# Patient Record
Sex: Female | Born: 1955 | Race: White | Hispanic: No | Smoking: Never smoker
Health system: Southern US, Community
[De-identification: ages and names within clinical notes are randomized; demographics above are authoritative.]

## PROBLEM LIST (undated history)

## (undated) DIAGNOSIS — N189 Chronic kidney disease, unspecified: Secondary | ICD-10-CM

## (undated) DIAGNOSIS — I700 Atherosclerosis of aorta: Secondary | ICD-10-CM

## (undated) DIAGNOSIS — E785 Hyperlipidemia, unspecified: Secondary | ICD-10-CM

## (undated) DIAGNOSIS — N183 Chronic kidney disease, stage 3 unspecified: Secondary | ICD-10-CM

## (undated) DIAGNOSIS — Z789 Other specified health status: Secondary | ICD-10-CM

## (undated) DIAGNOSIS — R0683 Snoring: Secondary | ICD-10-CM

## (undated) DIAGNOSIS — I2510 Atherosclerotic heart disease of native coronary artery without angina pectoris: Secondary | ICD-10-CM

## (undated) HISTORY — DX: Atherosclerosis of aorta: I70.0

## (undated) HISTORY — DX: Hyperlipidemia, unspecified: E78.5

## (undated) HISTORY — DX: Chronic kidney disease, stage 3 unspecified: N18.30

## (undated) HISTORY — DX: Atherosclerotic heart disease of native coronary artery without angina pectoris: I25.10

## (undated) HISTORY — DX: Chronic kidney disease, unspecified: N18.9

---

## 1998-05-19 ENCOUNTER — Other Ambulatory Visit: Admission: RE | Admit: 1998-05-19 | Discharge: 1998-05-19 | Payer: Self-pay | Admitting: Obstetrics and Gynecology

## 1998-08-15 HISTORY — PX: BREAST SURGERY: SHX581

## 1998-11-06 ENCOUNTER — Ambulatory Visit (HOSPITAL_COMMUNITY): Admission: RE | Admit: 1998-11-06 | Discharge: 1998-11-06 | Payer: Self-pay | Admitting: *Deleted

## 2000-07-10 ENCOUNTER — Ambulatory Visit (HOSPITAL_COMMUNITY): Admission: RE | Admit: 2000-07-10 | Discharge: 2000-07-10 | Payer: Self-pay | Admitting: Gastroenterology

## 2001-07-04 ENCOUNTER — Other Ambulatory Visit: Admission: RE | Admit: 2001-07-04 | Discharge: 2001-07-04 | Payer: Self-pay | Admitting: *Deleted

## 2001-08-31 ENCOUNTER — Other Ambulatory Visit: Admission: RE | Admit: 2001-08-31 | Discharge: 2001-08-31 | Payer: Self-pay | Admitting: *Deleted

## 2002-08-05 ENCOUNTER — Other Ambulatory Visit: Admission: RE | Admit: 2002-08-05 | Discharge: 2002-08-05 | Payer: Self-pay | Admitting: Obstetrics and Gynecology

## 2002-12-16 ENCOUNTER — Ambulatory Visit (HOSPITAL_COMMUNITY): Admission: RE | Admit: 2002-12-16 | Discharge: 2002-12-16 | Payer: Self-pay | Admitting: Family Medicine

## 2002-12-16 ENCOUNTER — Encounter: Payer: Self-pay | Admitting: Family Medicine

## 2003-09-09 ENCOUNTER — Other Ambulatory Visit: Admission: RE | Admit: 2003-09-09 | Discharge: 2003-09-09 | Payer: Self-pay | Admitting: Obstetrics and Gynecology

## 2005-05-06 ENCOUNTER — Ambulatory Visit: Payer: Self-pay | Admitting: Cardiology

## 2005-06-17 ENCOUNTER — Ambulatory Visit: Payer: Self-pay | Admitting: Cardiology

## 2005-06-29 ENCOUNTER — Ambulatory Visit: Payer: Self-pay | Admitting: Cardiology

## 2005-08-19 ENCOUNTER — Ambulatory Visit: Payer: Self-pay | Admitting: Cardiology

## 2005-09-01 ENCOUNTER — Ambulatory Visit: Payer: Self-pay | Admitting: Cardiology

## 2005-09-23 ENCOUNTER — Ambulatory Visit: Payer: Self-pay | Admitting: Cardiovascular Disease

## 2005-09-29 ENCOUNTER — Ambulatory Visit: Payer: Self-pay | Admitting: Internal Medicine

## 2005-11-09 ENCOUNTER — Ambulatory Visit: Payer: Self-pay | Admitting: Cardiology

## 2005-11-10 ENCOUNTER — Ambulatory Visit: Payer: Self-pay | Admitting: Internal Medicine

## 2006-05-11 ENCOUNTER — Ambulatory Visit: Payer: Self-pay

## 2006-05-11 ENCOUNTER — Ambulatory Visit: Payer: Self-pay | Admitting: Cardiology

## 2007-02-12 ENCOUNTER — Ambulatory Visit: Payer: Self-pay

## 2007-02-12 LAB — CONVERTED CEMR LAB
ALT: 19 units/L (ref 0–35)
AST: 22 units/L (ref 0–37)
Albumin: 3.5 g/dL (ref 3.5–5.2)
Alkaline Phosphatase: 33 units/L — ABNORMAL LOW (ref 39–117)
Bilirubin, Direct: 0.1 mg/dL (ref 0.0–0.3)
Cholesterol: 260 mg/dL (ref 0–200)
Direct LDL: 175.1 mg/dL
HDL: 54.3 mg/dL (ref >39.0)
Total Bilirubin: 1.1 mg/dL (ref 0.3–1.2)
Total CHOL/HDL Ratio: 4.8
Total Protein: 6.2 g/dL (ref 6.0–8.3)
Triglycerides: 66 mg/dL (ref 0–149)
VLDL: 13 mg/dL (ref 0–40)

## 2008-03-05 ENCOUNTER — Ambulatory Visit: Payer: Self-pay | Admitting: Internal Medicine

## 2008-03-05 LAB — CONVERTED CEMR LAB
ALT: 16 units/L (ref 0–35)
AST: 16 units/L (ref 0–37)
Albumin: 3.9 g/dL (ref 3.5–5.2)
Alkaline Phosphatase: 44 units/L (ref 39–117)
Bilirubin, Direct: 0.1 mg/dL (ref 0.0–0.3)
Cholesterol: 255 mg/dL (ref 0–200)
Direct LDL: 181.5 mg/dL
HDL: 54.6 mg/dL (ref >39.0)
Total Bilirubin: 1.4 mg/dL — ABNORMAL HIGH (ref 0.3–1.2)
Total CHOL/HDL Ratio: 4.7
Total Protein: 7 g/dL (ref 6.0–8.3)
Triglycerides: 94 mg/dL (ref 0–149)
VLDL: 19 mg/dL (ref 0–40)

## 2008-03-10 ENCOUNTER — Ambulatory Visit: Payer: Self-pay | Admitting: Cardiovascular Disease

## 2008-04-24 ENCOUNTER — Ambulatory Visit: Payer: Self-pay | Admitting: Cardiology

## 2008-04-24 LAB — CONVERTED CEMR LAB
ALT: 25 units/L (ref 0–35)
AST: 19 units/L (ref 0–37)
Albumin: 4.1 g/dL (ref 3.5–5.2)
Alkaline Phosphatase: 40 units/L (ref 39–117)
Bilirubin, Direct: 0.1 mg/dL (ref 0.0–0.3)
Cholesterol: 269 mg/dL (ref 0–200)
Direct LDL: 204.6 mg/dL
HDL: 56.2 mg/dL (ref >39.0)
Total Bilirubin: 1.3 mg/dL — ABNORMAL HIGH (ref 0.3–1.2)
Total CHOL/HDL Ratio: 4.8
Total Protein: 7 g/dL (ref 6.0–8.3)
Triglycerides: 87 mg/dL (ref 0–149)
VLDL: 17 mg/dL (ref 0–40)

## 2008-04-28 ENCOUNTER — Ambulatory Visit: Payer: Self-pay

## 2008-06-12 ENCOUNTER — Ambulatory Visit: Payer: Self-pay | Admitting: Internal Medicine

## 2008-06-12 LAB — CONVERTED CEMR LAB
ALT: 26 units/L (ref 0–35)
AST: 20 units/L (ref 0–37)
Albumin: 3.9 g/dL (ref 3.5–5.2)
Alkaline Phosphatase: 49 units/L (ref 39–117)
Bilirubin, Direct: 0.2 mg/dL (ref 0.0–0.3)
Cholesterol: 194 mg/dL (ref 0–200)
HDL: 51.6 mg/dL (ref >39.0)
LDL Cholesterol: 131 mg/dL — ABNORMAL HIGH (ref 0–99)
Total Bilirubin: 1.2 mg/dL (ref 0.3–1.2)
Total CHOL/HDL Ratio: 3.8
Total Protein: 7.1 g/dL (ref 6.0–8.3)
Triglycerides: 56 mg/dL (ref 0–149)
VLDL: 11 mg/dL (ref 0–40)

## 2008-06-16 ENCOUNTER — Ambulatory Visit: Payer: Self-pay | Admitting: Cardiovascular Disease

## 2008-07-31 ENCOUNTER — Ambulatory Visit: Payer: Self-pay

## 2008-07-31 LAB — CONVERTED CEMR LAB
ALT: 25 units/L (ref 0–35)
AST: 22 units/L (ref 0–37)
Albumin: 4 g/dL (ref 3.5–5.2)
Alkaline Phosphatase: 46 units/L (ref 39–117)
Bilirubin, Direct: 0.1 mg/dL (ref 0.0–0.3)
Cholesterol: 182 mg/dL (ref 0–200)
HDL: 48.9 mg/dL (ref >39.0)
LDL Cholesterol: 124 mg/dL — ABNORMAL HIGH (ref 0–99)
Total Bilirubin: 0.8 mg/dL (ref 0.3–1.2)
Total CHOL/HDL Ratio: 3.7
Total Protein: 6.9 g/dL (ref 6.0–8.3)
Triglycerides: 44 mg/dL (ref 0–149)
VLDL: 9 mg/dL (ref 0–40)

## 2008-08-04 ENCOUNTER — Ambulatory Visit: Payer: Self-pay | Admitting: Cardiology

## 2009-01-14 ENCOUNTER — Ambulatory Visit: Payer: Self-pay | Admitting: Internal Medicine

## 2009-01-14 DIAGNOSIS — E7800 Pure hypercholesterolemia, unspecified: Secondary | ICD-10-CM | POA: Insufficient documentation

## 2009-01-14 LAB — CONVERTED CEMR LAB
ALT: 23 units/L (ref 0–35)
AST: 22 units/L (ref 0–37)
Albumin: 3.8 g/dL (ref 3.5–5.2)
Alkaline Phosphatase: 47 units/L (ref 39–117)
Cholesterol: 187 mg/dL (ref 0–200)
HDL: 50.2 mg/dL (ref >39.00)
Triglycerides: 58 mg/dL (ref 0.0–149.0)

## 2009-01-19 ENCOUNTER — Ambulatory Visit: Payer: Self-pay | Admitting: Cardiovascular Disease

## 2009-07-20 ENCOUNTER — Ambulatory Visit: Payer: Self-pay | Admitting: Cardiology

## 2009-07-20 LAB — CONVERTED CEMR LAB
ALT: 24 units/L (ref 0–35)
Cholesterol: 230 mg/dL — ABNORMAL HIGH (ref 0–200)
Direct LDL: 164 mg/dL
Total Bilirubin: 1 mg/dL (ref 0.3–1.2)
Total CHOL/HDL Ratio: 4

## 2009-07-23 ENCOUNTER — Ambulatory Visit: Payer: Self-pay | Admitting: Cardiovascular Disease

## 2010-01-25 ENCOUNTER — Ambulatory Visit: Payer: Self-pay | Admitting: Cardiovascular Disease

## 2010-01-27 ENCOUNTER — Encounter (INDEPENDENT_AMBULATORY_CARE_PROVIDER_SITE_OTHER): Payer: Self-pay | Admitting: *Deleted

## 2010-01-27 LAB — CONVERTED CEMR LAB
AST: 19 units/L (ref 0–37)
Bilirubin, Direct: 0.2 mg/dL (ref 0.0–0.3)
Cholesterol: 205 mg/dL — ABNORMAL HIGH (ref 0–200)
Total Bilirubin: 1.1 mg/dL (ref 0.3–1.2)
Total CHOL/HDL Ratio: 3
VLDL: 13.4 mg/dL (ref 0.0–40.0)

## 2010-01-28 ENCOUNTER — Ambulatory Visit: Payer: Self-pay | Admitting: Cardiology

## 2010-02-08 ENCOUNTER — Emergency Department (HOSPITAL_COMMUNITY): Admission: EM | Admit: 2010-02-08 | Discharge: 2010-02-09 | Payer: Self-pay | Admitting: Emergency Medicine

## 2010-07-19 ENCOUNTER — Ambulatory Visit: Payer: Self-pay | Admitting: Internal Medicine

## 2010-07-22 ENCOUNTER — Ambulatory Visit: Payer: Self-pay | Admitting: Cardiovascular Disease

## 2010-08-19 ENCOUNTER — Other Ambulatory Visit: Payer: Self-pay | Admitting: Internal Medicine

## 2010-08-19 LAB — HEPATIC FUNCTION PANEL
ALT: 35 U/L (ref 0–35)
AST: 24 U/L (ref 0–37)
Albumin: 4 g/dL (ref 3.5–5.2)
Alkaline Phosphatase: 55 U/L (ref 39–117)
Bilirubin, Direct: 0.1 mg/dL (ref 0.0–0.3)
Total Bilirubin: 1.2 mg/dL (ref 0.3–1.2)
Total Protein: 6.8 g/dL (ref 6.0–8.3)

## 2010-08-19 LAB — LIPID PANEL
Cholesterol: 254 mg/dL — ABNORMAL HIGH (ref 0–200)
HDL: 51.8 mg/dL (ref >39.00)
Total CHOL/HDL Ratio: 5
Triglycerides: 72 mg/dL (ref 0.0–149.0)
VLDL: 14.4 mg/dL (ref 0.0–40.0)

## 2010-08-19 LAB — LDL CHOLESTEROL, DIRECT: Direct LDL: 199.7 mg/dL

## 2010-09-14 NOTE — Assessment & Plan Note (Signed)
Summary: Candice [name]   Visit Type:  Follow-up  CC:  dyslipidemia follow-up.  History of Present Illness:     Lipid Clinic Visit      The patient comes in today for dyslipidemia follow-up.  The patient has no complaints of medication problems, muscle aches, muscle cramps, or back pain.  She has been compliant with Crestor 40mg  daily.  Pt broke her leg in January and had to wear a boot for 14 weeks.  This has limited her exercise over the past few months.  She has not been able to do her normal aerobic exercise but has tried to get some exercise in as well as yoga.  Dietary compliance review reveals she is trying to eat a healthy diet.  Her diet consists mostly of salads and protien shakes.  If she does eat protein it is mainly fish and occassionally chicken but very little red meat.  Given her recent broken bone, we discussed her calcium intake.  She does drink an iced latte from [facility] every morning and eats yogurt many days of the week.  She currently takes 1 calcium 600mg  with vitamin D.      Lipid Management Provider  Weston Brass, PharmD  Current Medications (verified): 1)  Crestor 40 Mg Tabs (Rosuvastatin Calcium) .... Take One Tablet By Mouth Daily. 2)  Calcium-Vitamin D 250-125 Mg-Unit Tabs (Calcium Carbonate-Vitamin D) .... Take 1 Tablet Daily 3)  Aspirin 81 Mg Tbec (Aspirin) .... Take One Tablet By Mouth Daily  Allergies (verified): No Known Drug Allergies  Past History:  Family History: Last updated: 02-15-10 Father- died at 63 with MI Mother- alive and well at 56  Family History: Father- died at 45 with MI Mother- alive and well at 46   Vital Signs:  Patient profile:   55 year old female Weight:      189 pounds BP sitting:   124 / 80  (right arm) Cuff size:   regular  Impression & Recommendations:  Problem # 1:  PURE HYPERCHOLESTEROLEMIA (ICD-272.0) Assessment Improved Pt's cholesterol has improved since last checked in December.  Of note, she had not taken  her Crestor on a regular basis before previous lab work.  Her TC is slightly above goal at 205, TG -  67, HDL above goal at 64 and LDL at 130.  Given her age and other risk factors, her Framingham risk score would be 1-2%.  Will aim for goal LDL<130 at this time.  Will not change medications at this time given she is currently right at goal.  If LDL increases, will consider adding additional agent.  Encouraged her to exercise as much as possible given her leg pain.  Will recheck her cholesterol in 6 months.    Her updated medication list for this problem includes:    Crestor 40 Mg Tabs (Rosuvastatin calcium) .Marland Kitchen... Take one tablet by mouth daily.  Patient Instructions: 1)  Continue Crestor 40mg  daily 2)  Continue current diet 3)  Exercise as tolerated  4)  Contact Dr. Felicity Coyer at [facility]- [phone] 5)  Next Lab Appt: 07/19/10 at 8:30 (fasting) 6)  Lipid Appt: 12/8 at 4pm

## 2010-09-14 NOTE — Letter (Signed)
Summary: [facility] - Lipid  [facility], Main Office  [address]   [place], [place] [address]   Phone: [phone]  Fax: [phone]     January 27, 2010 MRN: [id number]   [facility] [address] [place], [place]  [address]   Dear Ms. Epler,  We have reviewed your cholesterol results.  They are as follows:     Total Cholesterol:    205 (Desirable: less than 200)       HDL  Cholesterol:     63.80  (Desirable: greater than 40 for men and 50 for women)       LDL Cholesterol:       125  (Desirable: less than 100 for low risk and less than 70 for moderate to high risk)       Triglycerides:       67.0  (Desirable: less than 150)  Our recommendations include:These numbers look good. Continue on the same medicine. Liver function is normal. Take care, Dr. Leanora Cover.    Call our office at the number listed above if you have any questions.  Lowering your LDL cholesterol is important, but it is only one of a large number of "risk factors" that may indicate that you are at risk for heart disease, stroke or other complications of hardening of the arteries.  Other risk factors include:   A.  Cigarette Smoking* B.  High Blood Pressure* C.  Obesity* D.   Low HDL Cholesterol (see yours above)* E.   Diabetes Mellitus (higher risk if your is uncontrolled) F.  Family history of premature heart disease G.  Previous history of stroke or cardiovascular disease    *These are risk factors YOU HAVE CONTROL OVER.  For more information, visit .  There is now evidence that lowering the TOTAL CHOLESTEROL AND LDL CHOLESTEROL can reduce the risk of heart disease.  The American Heart Association recommends the following guidelines for the treatment of elevated cholesterol:  1.  If there is now current heart disease and less than two risk factors, TOTAL CHOLESTEROL should be less than 200 and LDL CHOLESTEROL should be less than 100. 2.  If there is current heart disease or two  or more risk factors, TOTAL CHOLESTEROL should be less than 200 and LDL CHOLESTEROL should be less than 70.  A diet low in cholesterol, saturated fat, and calories is the cornerstone of treatment for elevated cholesterol.  Cessation of smoking and exercise are also important in the management of elevated cholesterol and preventing vascular disease.  Studies have shown that 30 to 60 minutes of physical activity most days can help lower blood pressure, lower cholesterol, and keep your weight at a healthy level.  Drug therapy is used when cholesterol levels do not respond to therapeutic lifestyle changes (smoking cessation, diet, and exercise) and remains unacceptably high.  If medication is started, it is important to have you levels checked periodically to evaluate the need for further treatment options.  Thank you,    [employer] Team

## 2010-09-16 NOTE — Assessment & Plan Note (Signed)
Summary: rov/sp  Medications Added DAILY MULTIPLE VITAMINS  TABS (MULTIPLE VITAMIN) take 1 tablet daily      Allergies Added: ! * ZETIA  Visit Type:  Follow-up  CC:  dyslipidemia follow-up.  History of Present Illness:  Lipid Clinic Visit      The patient comes in today for dyslipidemia follow-up.  Her past medical history is significant for dyslipidemia only- no history of HTN, DM, or any other cardiac disease.  Despite meeting LDL goals in June 2011(130 mg/dL), her LDL has increased significantly to 199.7 mg/dL this month.  This follow-up visit will address the causes of this worsening of her dyslipidemia.  Dietary compliance review reveals an overall grade of limiting fats and TFA's.  Her breakfast consists of a non-fat latte and a [employer].  Lunch and dinner most often consists of chicken and vegetables.  Snacks throughout the day include yogurt or almonds.  She typically consumes 1 glass of wine a night.  Review of exercise habits reveals that the patient is exercising with a trainer 3 times a week for 60 minutes.  Despite relatively strict diet and excerise program for multiple months, patient has not lost weight.  Patient admits losing track with her low fat diet during the holiday season and believes her most recent LDL is a reflection of her recent eating habits.  Compliance with Crestor is good, but admits missing her tablets  ~4 days per month.  She denies problems with Crestor and tolerates the medication well.   Lipid Clinic Visit      The patient comes in today for dyslipidemia follow-up.  The patient has no complaints of medication problems, muscle aches, muscle cramps, or back pain.  She has been compliant with Crestor 40mg  daily.  Pt broke her leg in January and had to wear a boot for 14 weeks.  This has limited her exercise over the past few months.  She has not been able to do her normal aerobic exercise but has tried to get some exercise in as well as yoga.  Dietary  compliance review reveals she is trying to eat a healthy diet.  Her diet consists mostly of salads and protien shakes.  If she does eat protein it is mainly fish and occassionally chicken but very little red meat.  Given her recent broken bone, we discussed her calcium intake.  She does drink an iced latte from [facility] every morning and eats yogurt many days of the week.  She currently takes 1 calcium 600mg  with vitamin D.      Lipid Management Provider  Weston Brass, PharmD  Preventive Screening-Counseling & Management  Alcohol-Tobacco     Alcohol drinks/day: 1     Alcohol type: wine     Smoking Status: never  Caffeine-Diet-Exercise     Caffeine use/day: 1 non-fat latte in AM     Caffeine Counseling: not indicated; caffeine use is not excessive or problematic     Does Patient Exercise: yes     Type of exercise: sessions with trainer     Exercise (avg: min/session): 60     Times/week: 3  Current Medications (verified): 1)  Crestor 40 Mg Tabs (Rosuvastatin Calcium) .... Take One Tablet By Mouth Daily. 2)  Calcium-Vitamin D 250-125 Mg-Unit Tabs (Calcium Carbonate-Vitamin D) .... Take 1 Tablet Daily 3)  Aspirin 81 Mg Tbec (Aspirin) .... Take One Tablet By Mouth Daily 4)  Daily Multiple Vitamins  Tabs (Multiple Vitamin) .... Take 1 Tablet Daily  Allergies (verified):  1)  ! * Zetia  Social History: Alcohol drinks/day:  1 Smoking Status:  never Caffeine use/day:  1 non-fat latte in AM Does Patient Exercise:  yes   Vital Signs:  Patient profile:   55 year old female Weight:      182.75 pounds Pulse rate:   70 / minute BP sitting:   118 / 79  Impression & Recommendations:  Problem # 1:  PURE HYPERCHOLESTEROLEMIA (ICD-272.0) Patient's dyslipidemia has worsened since 6 months ago, showing an impressive increase in LDL.  Patient admits to missing  ~4 tablets/month of her Crestor 40mg , which I do not consider substantial enough to raise her LDL by 70 mg/dL.  Patient's normal diet and  exercise plan was on track to controlling LDL; however, patient feels that she has not been following her normal healthy, low-fat diet during the holiday season contributing to most recent high LDL.  Patient would like to recheck lipid panel in 2 months after she returns to normal eating schedule before considering additional lipid lowering medications.  Will recheck lipid panel in March and reassess then. 08/19/10 Lipid Panel: LDL 199.7 mg/dL (goal 130mg /dL) TC 161  TG 72  HDL 09.6 01/25/10 LDL 130 mg/dL  Her updated medication list for this problem includes:    Crestor 40 Mg Tabs (Rosuvastatin calcium) .Marland Kitchen... Take one tablet by mouth daily.  Patient Instructions: 1)  Continue taking Crestor 40mg .  2)  Continue with diet and exercise plan. 3)  Return to clinic in March for follow-up. Prescriptions: CRESTOR 40 MG TABS (ROSUVASTATIN CALCIUM) Take one tablet by mouth daily.  #90 x 3   Entered by:   Louann Sjogren PharmD   Authorized by:   Norva Karvonen, MD   Signed by:   Louann Sjogren PharmD on 08/23/2010   Method used:   Faxed to ...       [facility] (mail-order)             , [place]         Ph: [phone]       Fax: [phone]   RxID:   [id number]

## 2010-10-31 LAB — DIFFERENTIAL
Basophils Absolute: 0 10*3/uL (ref 0.0–0.1)
Basophils Relative: 0 % (ref 0–1)
Eosinophils Absolute: 0.1 10*3/uL (ref 0.0–0.7)
Neutro Abs: 4.5 10*3/uL (ref 1.7–7.7)
Neutrophils Relative %: 56 % (ref 43–77)

## 2010-10-31 LAB — BASIC METABOLIC PANEL
BUN: 10 mg/dL (ref 6–23)
Calcium: 9.6 mg/dL (ref 8.4–10.5)
GFR calc non Af Amer: >60 mL/min (ref >60)
Glucose, Bld: 88 mg/dL (ref 70–99)
Sodium: 143 mEq/L (ref 135–145)

## 2010-10-31 LAB — POCT CARDIAC MARKERS
Myoglobin, poc: 71.7 ng/mL (ref 12–200)
Troponin i, poc: <0.05 ng/mL (ref 0.00–0.09)

## 2010-10-31 LAB — CBC
Hemoglobin: 13.8 g/dL (ref 12.0–15.0)
MCHC: 34.7 g/dL (ref 30.0–36.0)
RDW: 13.4 % (ref 11.5–15.5)

## 2010-11-01 ENCOUNTER — Other Ambulatory Visit: Payer: Self-pay

## 2010-11-08 ENCOUNTER — Ambulatory Visit: Payer: Self-pay

## 2010-12-28 NOTE — Assessment & Plan Note (Signed)
[facility]                               LIPID CLINIC NOTE   NAME:Candice [name], Candice [name]                 MRN:          [id number]  DATE:08/04/2008                            DOB:          Oct 02, 1955    HISTORY:  Candice [name] is seen back in the Lipid Clinic.  She has been  feeling and doing well overall.  She has been working on eating more  hopefully.  She is preparing to leave her job and she is looking very  possibly about this.   PAST MEDICAL HISTORY:  Pertinent for high risk features.   CURRENT MEDICATIONS:  1. Crestor 20 mg daily.  2. Zetia 10 mg daily.   REVIEW OF SYSTEMS:  As stated in the HPI, otherwise negative.   PHYSICAL EXAMINATION:  VITAL SIGNS:  Weight today is 184 pounds, blood  pressure is 140/72, respirations are 16.   Labs from June 12, 2008, reveal normal LFTs, total cholesterol 194,  triglycerides 56, HDL 56, and LDL 131.  Further labs obtained on  July 31, 2008, reveal normal LFTs, a total cholesterol of 182,  triglycerides 44, HDL 48.9, and LDL 124.   ASSESSMENT:  The patient has made progress.  She is continuing to work  on eating.   PLAN:  We have discussed ARBITER 6 and the patient agrees to discontinue  Zetia at this time.  Continue paying attention and focus on her eating,  may in the future need to increase her Crestor 40.  She will let me know  how she is feeling about this in the next several days and weeks.  She  will follow up with this in 6 months.  She will have labs checked in the  meantime if muscle aches, pain, weakness, fatigue, or other problems  develop.      Shelby Dubin, PharmD, BCPS, CPP  Electronically Signed      Rollene Rotunda, MD, [facility]  Electronically Signed   MP/MedQ  DD: 09/08/2008  DT: 09/09/2008  Job #: [id number]   cc:   Pricilla Riffle, MD, [facility]

## 2010-12-28 NOTE — Assessment & Plan Note (Signed)
[facility]                               LIPID CLINIC NOTE   NAME:Justo, BRYNDA HEICK                 MRN:          [id number]  DATE:06/16/2008                            DOB:          1955/10/07    HISTORY OF PRESENT ILLNESS:  Ms. Miao is seen in the Lipid Clinic  for further evaluation and medication titration associated with her  hyperlipidemia.  She relates that she has been laid off from her job  effective in December of 2009.  She states that while she is somewhat  saddened by this that she really is excited at this opportunity, as she  has not been happy for some time.  She is taking this as an opportunity  in her words to get back into things that she enjoys.  She is working.  She improved her dietary intake per her report.  She is working to  include low-fat, low-cholesterol medications.  She is not spending time  at the gym, though she is trying to increase this some what.  She does  admit that she does continue to see her trainer on a regular basis 3-4  times a week for an hour.  She is drinking 1 glass of wine each day.   PAST MEDICAL HISTORY:  Hyperlipidemia and obesity.   CURRENT MEDICATIONS:  Include  1. Crestor 20 mg daily.  2. Zetia 10 mg daily.   PHYSICAL EXAMINATION:  Weight is 189 pounds, blood pressure is 120/76,  and heart rate is 68.   LABORATORY DATA:  Labs on June 12, 2008 revealed total cholesterol  194, triglycerides 56, HDL 51.6, and LDL 131.  LFTs within normal  limits.   ASSESSMENT:  The patient's LDL is still above her goal of 100, but  otherwise they are in good range.   PLAN:  We will increase her Crestor to 40 mg each day.  We will continue  Zetia.  She will work to increase her fiber, fruits, and vegetables and  continue on to exercise and increase her exercise accordingly.  We will  check her labs on August 04, 2008 to evaluate liver and lipids.  The patient has been given 9-day prescription for  Crestor and Zetia.  I  will see her back after those labs are completed.     Shelby Dubin, PharmD, BCPS, CPP  Electronically Signed      Rollene Rotunda, MD, [facility]  Electronically Signed   MP/MedQ  DD: 06/26/2008  DT: 06/26/2008  Job #: [id number] [id number]

## 2010-12-28 NOTE — Assessment & Plan Note (Signed)
[facility]                               LIPID CLINIC NOTE   NAME:Candice [name], Candice [name]                 MRN:          [id number]  DATE:03/10/2008                            DOB:          [birth year]    ADDENDUM   I was in the discussion.  The patient has agreed to participate and  continue to make improvement in her overall health status and to work to  attain appropriate measure of her    INCOMPLETE     Shelby Dubin, PharmD, BCPS, CPP    MP/MedQ  DD: 03/11/2008  DT: 03/12/2008  Job #: [id number]

## 2010-12-31 NOTE — Assessment & Plan Note (Signed)
[facility]                               LIPID CLINIC NOTE   NAME:Candice [name]                 MRN:          [id number]  DATE:03/11/2008                            DOB:          03/22/1956    Candice [name] is seen after a 2-year absence in the Lipid Clinic for  further evaluation and medication titration associated with her history  of hyperlipidemia.  The patient states that she has been compliant with  her Crestor 10 mg daily therapy for 22 of the past 30 days.  She has  been also compliant with her calcium and vitamin C, which she only takes  as needed.  She has been exercising with a personal trainer 3 days each  week and completing 2 days of additional workout using both treadmill  and elliptical devices for 30 minutes a session for a 5-week daily total  of exercise.  The patient's diet has not been, in her words, up to par.  She does not use tobacco and enjoys a glass of wine each evening though  she has cut this out as a potential source of increased calories  increasing her weight.   PHYSICAL EXAMINATION:  Weight today is 186-1/4 pounds, blood pressure is  186/64, and her respiratory rate is 15.   CURRENT MEDICATIONS:  1. Crestor 10 mg daily.  2. Calcium.  3. Vitamin C p.r.n.   Labs obtained on March 05, 2008, reveal direct LDL of 181.5,  triglycerides of 94, HDL 54.6, total cholesterol 255, and normal LFTs  except total bilirubin slightly elevated at 1.4.   ASSESSMENT:  I have spoken with the patient very directly that we had  not ever attained an appropriate level of lipid control and that further  therapy titration is indicated at this time.  The patient verbalizes  understanding of this and agrees to move forward with this.  The patient agrees to participate in further followup associated with  her hyperlipidemia management.  I have shared with her my concerns that  single therapy without significant increase in exercise and  dietary  limitation will not get [place] to our LDL goal of less than 100.  I have  also shared with her our concern about her potential for metabolic  syndrome.  Therefore, the patient agrees to increase her Crestor 20 mg  daily.  Per her request, we are sending a 30-day prescription to  [facility] on [address] for 1 tablet daily with no refills.  I have given  a written prescription to the patient for [facility] for 90 days  with 2 refills.  She will return to  the office in 8 weeks for labs and further followup.  I appreciate the  opportunity to see this pleasant patient.      Shelby Dubin, PharmD, BCPS, CPP  Electronically Signed      Rollene Rotunda, MD, [facility]  Electronically Signed   MP/MedQ  DD: 03/11/2008  DT: 03/12/2008  Job #: [id number]   cc:   Stacie Acres. Cliffton Asters, M.D.

## 2010-12-31 NOTE — Assessment & Plan Note (Signed)
[facility]                              CARDIOLOGY OFFICE NOTE   NAME:Candice [name], Candice [name]                 MRN:          [id number]  DATE:05/11/2006                            DOB:          13-Apr-1956    Ms. Yetman comes in today feeling good.  She has been tolerating her  medicines just fine which include Crestor 10 mg daily and a calcium  supplement.  She recently stopped taking a baby aspirin daily.   Her weight today is 175 pounds.  Blood pressure 118/72.  Heart rate 60.  She has been continuing to work out with a trainer three times a week and is  enjoying that and continues to lose weight.  As far as diet goes, she has  been increasing the amount of natural foods, she is eating lots more  vegetables, and she has cut out alcohol completely.  There was some  confusion on laboratory work this appointment so she is fasting this morning  and will get that lab work drawn today and call her back with results and  the plan.  Her numbers were very close to goal last time we saw her six  months ago.  If they are at goal this time, we may ask her to follow up with  her primary care physician and continue on Crestor 10 mg daily and have the  primary care doctor refer her back to [place] if there are any problems in the  future.      ______________________________  Charolotte Eke, PharmD    ______________________________  Arturo Morton. Riley Kill, MD, [facility]    TP/MedQ  DD:  05/11/2006  DT:  05/12/2006  Job #:  [id number]

## 2010-12-31 NOTE — Procedures (Signed)
[facility]  Patient:    Candice [name], Candice [name]                 MRN: [id number] Proc. Date: 07/10/00 Attending:  Verlin Grills, M.D. CC:         Stacie Acres. Cliffton Asters, M.D.  Sung Amabile Roslyn Smiling, M.D.   Procedure Report  PROCEDURE:  Flexible proctosigmoidoscopy.  REFERRING PHYSICIAN:  Dr. Laurann Montana, Dr. Jake Church.  INDICATIONS FOR PROCEDURE:  [name] (DOB 1956/04/09) is a 55 year old female referred for a diagnostic colonoscopy to evaluate painless hematochezia.  With otherwise normal bowel movements, [name] will pass fresh blood. The blood can be mixed in this stool and can actually turn the commode water red. She denies anorectal pain, diarrhea or fever. She denies a personal or family history of colorectal neoplasia with the exception of a paternal aunt who developed colon cancer at age 54.  [name] viewed our colonoscopy education film. I discussd with her the complications associated with colonoscopy and polypectomy including intestinal bleeding and intestinal perforation. [name] has signed the operative permit.  ALLERGIES:  No known drug allergies.  CHRONIC MEDICATIONS:  Birth control pills and Zocor.  PAST MEDICAL HISTORY:  Breast augmentation, hypercholesterolemia.  To prepare for the colonoscopy, [name] was instructed to use the Fleets Phospho-Soda prep. The patient tells me she could not find the Fleets Phospho-Soda at 3 pharmacies and she was only given magnesium citrate to prepare for the colonoscopy. As a result, [name] has an inadequate prep for colonoscopy.  DESCRIPTION OF PROCEDURE:  After obtaining informed consent, the patient was placed in the left lateral decubitus position. I administered intravenous Demerol and intravenous Versed to achieve conscious sedation for the procedure. The patients blood pressure, oxygen saturation, and cardiac rhythm were monitored throughout  the procedure and documented in the medical record.  Anal inspection was normal. Digital rectal exam was normal. The Olympus pediatric video colonoscope was introduced into the rectum and advanced to approximately the splenic flexure. The rectum and colon was filled with solid and liquid stool. The prep was totally inadequate for flexible proctosigmoidoscopy and for colonoscopy. There were no gross lesions noted by flexible proctocolonoscopy to the splenic flexure but due to the inadequate colonic prep for the exam, small colonic polyps could have easily been missed. There was no indication of inflammatory bowel disease.  ASSESSMENT:  Inadequate colonic prep for endoscopic evaluation of the lower intestine. Flexible proctocolonoscopy performed to the splenic flexure revealed a large amount of solid and liquid stool but no active gastrointestinal bleeding, gross colonic polyps or inflammatory bowel disease.  RECOMMENDATIONS:  I would recommend scheduling [name] for a repeat flexible proctosigmoidoscopy followed by air contrast barium enema at her earlier convenience. DD:  07/10/00 TD:  07/10/00 Job: [id number]

## 2012-09-28 ENCOUNTER — Other Ambulatory Visit: Payer: Self-pay

## 2012-10-01 ENCOUNTER — Other Ambulatory Visit (INDEPENDENT_AMBULATORY_CARE_PROVIDER_SITE_OTHER): Payer: Self-pay

## 2012-10-01 ENCOUNTER — Other Ambulatory Visit: Payer: Self-pay | Admitting: *Deleted

## 2012-10-01 DIAGNOSIS — E7800 Pure hypercholesterolemia, unspecified: Secondary | ICD-10-CM

## 2012-10-01 LAB — HEPATIC FUNCTION PANEL
AST: 29 U/L (ref 0–37)
Albumin: 4 g/dL (ref 3.5–5.2)
Total Protein: 6.9 g/dL (ref 6.0–8.3)

## 2012-10-01 LAB — LDL CHOLESTEROL, DIRECT: Direct LDL: 140.8 mg/dL

## 2012-10-01 LAB — LIPID PANEL
Cholesterol: 202 mg/dL — ABNORMAL HIGH (ref 0–200)
Total CHOL/HDL Ratio: 4
Triglycerides: 107 mg/dL (ref 0.0–149.0)
VLDL: 21.4 mg/dL (ref 0.0–40.0)

## 2012-10-02 ENCOUNTER — Ambulatory Visit (INDEPENDENT_AMBULATORY_CARE_PROVIDER_SITE_OTHER): Payer: Managed Care, Other (non HMO) | Admitting: Pharmacist

## 2012-10-02 VITALS — Wt 206.0 lb

## 2012-10-02 DIAGNOSIS — E7800 Pure hypercholesterolemia, unspecified: Secondary | ICD-10-CM

## 2012-10-02 MED ORDER — ROSUVASTATIN CALCIUM 40 MG PO TABS: 40.0000 mg | ORAL_TABLET | Freq: Every day | ORAL | Status: DC

## 2012-10-02 NOTE — Patient Instructions (Addendum)
I am proud you have taken control of your life again!! 1.  Continue Crestor 40mg  daily 2.  Increase the pace of your walking to make 2mi in 3.  Decrease portion size of evening meal 4.  Make smart, low fat choices when out to eat 5.  Decrease alcohol intake to 1 glass 6.  Follow up Lipid Clinic appt May 19 Mon at 4pm 7.  Come the week before appt for fasting blood work  Thank you for enrolling in [facility]. Please follow the instructions below to securely access your online medical record. MyChart allows you to send messages to your doctor, view your test results, renew your prescriptions, schedule appointments, and more.  How Do I Sign Up? 1. In your Internet browser, go to [email or web address] 2. Click on the New  User? link in the Sign In box.  3. Enter your MyChart Access Code exactly as it appears below. You will not need to use this code after you have completed the sign-up process. If you do not sign up before the expiration date, you must request a new code. MyChart Access Code: [email or web address] Expires: 11/01/2012  9:26 AM  4. Enter the last four digits of your Social Security Number (xxxx) and Date of Birth (mm/dd/yyyy) as indicated and click Next. You will be taken to the next sign-up page. 5. Create a MyChart ID. This will be your MyChart login ID and cannot be changed, so think of one that is secure and easy to remember. 6. Create a MyChart password. You can change your password at any time. 7. Enter your Password Reset Question and Answer and click Next. This can be used at a later time if you forget your password.  8. Select your communication preference, and if applicable enter your e-mail address. You will receive e-mail notification when new information is available in MyChart by choosing to receive e-mail notifications and filling in your e-mail. 9. Click Sign In. You can now view your medical record.   Additional Information If you have questions,  you can email [email or web address] or call [phone] to talk to our MyChart staff. Remember, MyChart is NOT to be used for urgent needs. For medical emergencies, dial 911.

## 2012-10-11 NOTE — Progress Notes (Signed)
History of Present Illness:  Lipid Clinic Visit  - She is a previous pt but has not been seen for follow up in 2.78yrs.  Her best friends daughter died of Leukemia at age 55yo, 1.23yr ago and Candice [name] has been in a state of depression since that time and has done very little to care for herself.  She has had a 15+lb weight gain in this time, but has stayed compliant with her Crestor 40mg .  She has recently decided this was a disservice to herself and to the young girl who lost her cancer battle.   Her past medical history is significant for dyslipidemia only- no history of HTN, DM, or any other cardiac disease.  Her father died of an MI at 22yo and her mother is alive at 62 with no cardiac dz She does not smoke Diet Her breakfast consists of a non-fat latte, fruit and special K.   Lunch is usually a salad at work or a hot lunch from work Medical illustrator and a vegetable. Dinner is usually late night stop with friends at [employer] with appetizers, pasta meal and several drinks - wine or martinis.  She does snack throughout the day but has most recently started a food log which has helped decrease amount of snacking and she has made better food choices Exercise -she was not exercising until about 4 weeks ago she now walks with co-workers at lunchtime for .  Current outpatient prescriptions:rosuvastatin (CRESTOR) 40 MG tablet, Take 1 tablet (40 mg total) by mouth daily., Disp: 90 tablet, Rfl: 3

## 2012-10-11 NOTE — Assessment & Plan Note (Signed)
TC 202 ( > goal < 200) TG 107 ( at goal < 150) HDL 48 (at goal > 40) LDL 409 (> goal < 100) LFT wnl Compliant with Crestor 40mg  daily I am hesitant to make any medication changes at this time given that she clearly needs to make some lifestyle modification.   She seems very interested in improving her diet and committed to a daily exercise regimen.   She will continue to take Crestor 40mg  daily She will increase pace of walking to attain 2mi in 30-35min. She was encouraged to decrease ETOH to 1 glass in social settings, decrease portion sizes of meals and to make helathier choices of salads, lean prt and veggies when out to eat instead of higher fat content pastas with creamy sauces.   She will follow up for lipid panel in 3months.

## 2012-12-24 ENCOUNTER — Other Ambulatory Visit: Payer: Managed Care, Other (non HMO)

## 2012-12-28 ENCOUNTER — Ambulatory Visit: Payer: Managed Care, Other (non HMO) | Admitting: Pharmacist

## 2012-12-31 ENCOUNTER — Other Ambulatory Visit (INDEPENDENT_AMBULATORY_CARE_PROVIDER_SITE_OTHER): Payer: Managed Care, Other (non HMO)

## 2012-12-31 ENCOUNTER — Ambulatory Visit: Payer: Managed Care, Other (non HMO) | Admitting: Pharmacist

## 2012-12-31 DIAGNOSIS — E7800 Pure hypercholesterolemia, unspecified: Secondary | ICD-10-CM

## 2012-12-31 LAB — LIPID PANEL
HDL: 52.5 mg/dL (ref >39.00)
Triglycerides: 122 mg/dL (ref 0.0–149.0)

## 2012-12-31 LAB — HEPATIC FUNCTION PANEL
ALT: 28 U/L (ref 0–35)
AST: 20 U/L (ref 0–37)
Bilirubin, Direct: 0 mg/dL (ref 0.0–0.3)
Total Bilirubin: 0.8 mg/dL (ref 0.3–1.2)
Total Protein: 7.3 g/dL (ref 6.0–8.3)

## 2013-01-04 ENCOUNTER — Ambulatory Visit (INDEPENDENT_AMBULATORY_CARE_PROVIDER_SITE_OTHER): Payer: Managed Care, Other (non HMO) | Admitting: Pharmacist

## 2013-01-04 VITALS — Wt 202.5 lb

## 2013-01-04 DIAGNOSIS — E7800 Pure hypercholesterolemia, unspecified: Secondary | ICD-10-CM

## 2013-01-04 NOTE — Progress Notes (Signed)
Candice [name] is here for follow up of her hyperlipidemia.  She is currently taking Crestor 40 mg daily and reports no issues with this regimen.  She admits to forgetting to take it for a few days before her lab draw due to being out of town and out of her routine.   Diet: Candice [name] reports she has had an unhealthier diet the past couple weeks due to being out of town and eating out more. She reports she typically eats a healthy diet including lean meats and lots of vegetables.  She admits her downfall is that she goes out to eat with friends frequently, where she often has 2 glasses of wine.  She also reports that she is a "stress eater" and craves unhealthy foods when she is stressed.    Breakfast - coffee with nonfat creamer, sometimes also has cereal with soy milk Lunch - eats at cafeteria at work - salads or entree if it doesn't look unhealthy + veggies Dinner - salads, pizza, salmon, chicken. No pastas. Sometimes 2 glasses of wine with friends.    Candice [name] is motivated to lose weight and start making healthier diet choices. She is considering trying "juicing" but she thinks she will miss the action of chewing. She reports she eats an apple/day for a snack or breakfast, which makes her feel satisfied.   Exercise: She reports she works out 3x/week with her trainer. These sessions are 1 hour long and include cardio and strength training both.     Current Outpatient Prescriptions on File Prior to Visit  Medication Sig Dispense Refill  . rosuvastatin (CRESTOR) 40 MG tablet Take 1 tablet (40 mg total) by mouth daily.  90 tablet  3   No current facility-administered medications on file prior to visit.   Allergies  Allergen Reactions  . Ezetimibe     REACTION: Had severe muscle pains

## 2013-01-04 NOTE — Assessment & Plan Note (Addendum)
TC 240 (goal <200), TG 122 (at goal <150), HDL 52 (at goal >50), LDL up to 176.  LFTs wnl.  Pt forgot Crestor for a few days around lab draw and poor diet the past couple weeks. She is motivated to make lifestyle changes and wants to be held accountable for her goals at next visit.  Set goals for her to be to lose 10 lbs in the next 3 months. Her daughter is getting married the summer of 2015, which she is using as motivation.  We also set a goal to cut back to 1 glass of wine with dinner. Continue Crestor 40 mg daily and continue exercising with trainer 3 days/week.  Follow up in 3 months.  Pt reviewed with Weston Brass, PharmD, CPP

## 2013-01-04 NOTE — Patient Instructions (Addendum)
Goals for next time: - Lose 10 lbs in the next 3 months - Cut back to 1 glass of wine dinner  Continue your Crestor 40 mg daily. Continue exercising with your trainer 3 days/week.  Follow up in 3 months. Next appointment Friday August 22 at 3:30 pm.  Labs on Friday August 15 at 7:40 am.

## 2013-03-29 ENCOUNTER — Other Ambulatory Visit: Payer: Managed Care, Other (non HMO)

## 2013-04-05 ENCOUNTER — Ambulatory Visit: Payer: Managed Care, Other (non HMO) | Admitting: Pharmacist

## 2013-08-26 ENCOUNTER — Other Ambulatory Visit: Payer: Self-pay | Admitting: Cardiovascular Disease

## 2013-10-29 ENCOUNTER — Other Ambulatory Visit: Payer: Self-pay | Admitting: Cardiovascular Disease

## 2013-12-02 ENCOUNTER — Other Ambulatory Visit: Payer: Self-pay | Admitting: Gastroenterology

## 2014-01-21 ENCOUNTER — Encounter (HOSPITAL_COMMUNITY): Payer: Self-pay | Admitting: Pharmacy Technician

## 2014-01-30 ENCOUNTER — Encounter (HOSPITAL_COMMUNITY): Payer: Self-pay | Admitting: *Deleted

## 2014-02-11 ENCOUNTER — Encounter (HOSPITAL_COMMUNITY): Payer: Self-pay | Admitting: *Deleted

## 2014-02-11 ENCOUNTER — Encounter (HOSPITAL_COMMUNITY)
Admission: RE | Disposition: A | Discharge status: Home / Self Care | Payer: Self-pay | Source: Ambulatory Visit | Attending: Gastroenterology

## 2014-02-11 ENCOUNTER — Encounter (HOSPITAL_COMMUNITY): Payer: BC Managed Care – PPO | Admitting: Anesthesiology

## 2014-02-11 ENCOUNTER — Ambulatory Visit (HOSPITAL_COMMUNITY): Payer: BC Managed Care – PPO | Admitting: Anesthesiology

## 2014-02-11 ENCOUNTER — Ambulatory Visit (HOSPITAL_COMMUNITY)
Admission: RE | Admit: 2014-02-11 | Discharge: 2014-02-11 | Disposition: A | Discharge status: Home / Self Care | Payer: BC Managed Care – PPO | Source: Ambulatory Visit | Attending: Gastroenterology | Admitting: Gastroenterology

## 2014-02-11 DIAGNOSIS — Z860100 Personal history of colon polyps, unspecified: Secondary | ICD-10-CM | POA: Insufficient documentation

## 2014-02-11 DIAGNOSIS — D126 Benign neoplasm of colon, unspecified: Secondary | ICD-10-CM | POA: Insufficient documentation

## 2014-02-11 DIAGNOSIS — Z1211 Encounter for screening for malignant neoplasm of colon: Secondary | ICD-10-CM | POA: Insufficient documentation

## 2014-02-11 HISTORY — PX: COLONOSCOPY WITH PROPOFOL: SHX5780

## 2014-02-11 HISTORY — DX: Snoring: R06.83

## 2014-02-11 HISTORY — DX: Other specified health status: Z78.9

## 2014-02-11 SURGERY — COLONOSCOPY WITH PROPOFOL
Anesthesia: Monitor Anesthesia Care

## 2014-02-11 MED ORDER — MIDAZOLAM HCL 2 MG/2ML IJ SOLN
INTRAMUSCULAR | Status: AC
  Filled 2014-02-11: qty 2

## 2014-02-11 MED ORDER — ONDANSETRON HCL 4 MG/2ML IJ SOLN
INTRAMUSCULAR | Status: DC | PRN
  Administered 2014-02-11 (×2): 2 mg via INTRAVENOUS

## 2014-02-11 MED ORDER — PROPOFOL INFUSION 10 MG/ML OPTIME
INTRAVENOUS | Status: DC | PRN
  Administered 2014-02-11: 100 ug/kg/min via INTRAVENOUS

## 2014-02-11 MED ORDER — FENTANYL CITRATE 0.05 MG/ML IJ SOLN
INTRAMUSCULAR | Status: DC | PRN
  Administered 2014-02-11: 50 ug via INTRAVENOUS

## 2014-02-11 MED ORDER — LIDOCAINE HCL (CARDIAC) 20 MG/ML IV SOLN
INTRAVENOUS | Status: AC
  Filled 2014-02-11: qty 5

## 2014-02-11 MED ORDER — SODIUM CHLORIDE 0.9 % IV SOLN: INTRAVENOUS | Status: DC

## 2014-02-11 MED ORDER — ONDANSETRON HCL 4 MG/2ML IJ SOLN
INTRAMUSCULAR | Status: AC
  Filled 2014-02-11: qty 2

## 2014-02-11 MED ORDER — PROPOFOL 10 MG/ML IV BOLUS
INTRAVENOUS | Status: AC
  Filled 2014-02-11: qty 20

## 2014-02-11 MED ORDER — FENTANYL CITRATE 0.05 MG/ML IJ SOLN
INTRAMUSCULAR | Status: AC
  Filled 2014-02-11: qty 2

## 2014-02-11 MED ORDER — LACTATED RINGERS IV SOLN
INTRAVENOUS | Status: DC
  Administered 2014-02-11: 11:00:00 via INTRAVENOUS

## 2014-02-11 MED ORDER — KETAMINE HCL 10 MG/ML IJ SOLN
INTRAMUSCULAR | Status: DC | PRN
  Administered 2014-02-11: 10 mg via INTRAVENOUS
  Administered 2014-02-11: 15 mg via INTRAVENOUS

## 2014-02-11 MED ORDER — MIDAZOLAM HCL 5 MG/5ML IJ SOLN
INTRAMUSCULAR | Status: DC | PRN
  Administered 2014-02-11: 1 mg via INTRAVENOUS

## 2014-02-11 SURGICAL SUPPLY — 21 items

## 2014-02-11 NOTE — Op Note (Signed)
Procedure: Surveillance colonoscopy. Colonoscopy with removal of a small adenomatous polyp on 10/14/2008  Endoscopist: Earle Gell  Premedication: Propofol administered by anesthesia  Procedure: The patient was placed in the left lateral decubitus position. Anal inspection and digital rectal exam were normal. The Pentax pediatric colonoscope was introduced into the rectum and advanced to the cecum. A normal-appearing appendiceal orifice and ileocecal valve were identified. Colonic preparation for the exam today was good.  Rectum. Normal. Retroflex view of the distal rectum normal  Sigmoid colon and descending colon. Normal  Splenic flexure. Normal  Transverse colon. Normal  Hepatic flexure. Normal  Ascending colon. Normal  Cecum and ileocecal valve. From the mid cecum, a 3 mm sessile polyp was removed with the cold biopsy forceps and submitted for pathological interpretation  Assessment: From the cecum, a 3 mm sessile polyp was removed with the cold biopsy forceps; otherwise normal surveillance colonoscopy  Recommendation: If the cecal polyp returns neoplastic pathologically, the patient should undergo a surveillance colonoscopy in 5 years. If the polyp returns nonneoplastic pathologically, the patient should undergo a repeat surveillance colonoscopy in 10 years.

## 2014-02-11 NOTE — H&P (Signed)
  Procedure: Surveillance colonoscopy. Colonoscopy with removal of a small adenomatous colon polyp on 10/14/2008.  History: The patient is a 58 year old female born 09/12/1955. She is scheduled to undergo a surveillance colonoscopy today.  Past medical history: Breast augmentation surgery. Hypercholesterolemia  Medication allergies: None  Exam: The patient is alert and lying comfortably on the endoscopy stretcher. Abdomen is soft and nontender to palpation. Lungs are clear to auscultation. Cardiac exam reveals a regular rhythm.  Plan: Proceed with surveillance colonoscopy

## 2014-02-11 NOTE — Transfer of Care (Signed)
Immediate Anesthesia Transfer of Care Note  Patient: Candice [name]  Procedure(s) Performed: Procedure(s): COLONOSCOPY WITH PROPOFOL (N/A)  Patient Location: PACU  Anesthesia Type:MAC  Level of Consciousness: awake, alert , oriented and patient cooperative  Airway & Oxygen Therapy: Patient Spontanous Breathing and Patient connected to nasal cannula oxygen  Post-op Assessment: Report given to PACU RN and Post -op Vital signs reviewed and stable  Post vital signs: stable  Complications: No apparent anesthesia complications

## 2014-02-11 NOTE — Anesthesia Postprocedure Evaluation (Signed)
Anesthesia Post Note  Patient: Candice [name]  Procedure(s) Performed: Procedure(s) (LRB): COLONOSCOPY WITH PROPOFOL (N/A)  Anesthesia type: MAC  Patient location: PACU  Post pain: Pain level controlled  Post assessment: Post-op Vital signs reviewed  Last Vitals: BP 135/72  Pulse 67  Temp(Src) 36.4 C (Oral)  Resp 18  Ht 5\' 5"  (1.651 m)  Wt 175 lb (79.379 kg)  BMI 29.12 kg/m2  SpO2 100%  Post vital signs: Reviewed  Level of consciousness: awake  Complications: No apparent anesthesia complications

## 2014-02-11 NOTE — Anesthesia Preprocedure Evaluation (Signed)
Anesthesia Evaluation  Patient identified by MRN, date of birth, ID band Patient awake    Reviewed: Allergy & Precautions, H&P , NPO status , Patient's Chart, lab work & pertinent test results  Airway Mallampati: II TM Distance: >3 FB Neck ROM: Full    Dental  (+) Dental Advisory Given   Pulmonary neg pulmonary ROS,  breath sounds clear to auscultation        Cardiovascular negative cardio ROS  Rhythm:Regular Rate:Normal     Neuro/Psych negative neurological ROS  negative psych ROS   GI/Hepatic negative GI ROS, Neg liver ROS,   Endo/Other  negative endocrine ROS  Renal/GU negative Renal ROS     Musculoskeletal negative musculoskeletal ROS (+)   Abdominal   Peds  Hematology negative hematology ROS (+)   Anesthesia Other Findings   Reproductive/Obstetrics negative OB ROS                           Anesthesia Physical Anesthesia Plan  ASA: II  Anesthesia Plan: MAC   Post-op Pain Management:    Induction: Intravenous  Airway Management Planned:   Additional Equipment:   Intra-op Plan:   Post-operative Plan:   Informed Consent: I have reviewed the patients History and Physical, chart, labs and discussed the procedure including the risks, benefits and alternatives for the proposed anesthesia with the patient or authorized representative who has indicated his/her understanding and acceptance.   Dental advisory given  Plan Discussed with: CRNA  Anesthesia Plan Comments:         Anesthesia Quick Evaluation

## 2014-02-12 ENCOUNTER — Encounter (HOSPITAL_COMMUNITY): Payer: Self-pay | Admitting: Gastroenterology

## 2014-03-16 ENCOUNTER — Encounter (HOSPITAL_COMMUNITY): Payer: Self-pay | Admitting: Emergency Medicine

## 2014-03-16 ENCOUNTER — Emergency Department (INDEPENDENT_AMBULATORY_CARE_PROVIDER_SITE_OTHER): Payer: BC Managed Care – PPO

## 2014-03-16 ENCOUNTER — Emergency Department (HOSPITAL_COMMUNITY)
Admission: EM | Admit: 2014-03-16 | Discharge: 2014-03-16 | Disposition: A | Discharge status: Home / Self Care | Payer: BC Managed Care – PPO | Source: Home / Self Care | Attending: Family Medicine | Admitting: Family Medicine

## 2014-03-16 DIAGNOSIS — S52123A Displaced fracture of head of unspecified radius, initial encounter for closed fracture: Secondary | ICD-10-CM

## 2014-03-16 DIAGNOSIS — S52121A Displaced fracture of head of right radius, initial encounter for closed fracture: Secondary | ICD-10-CM

## 2014-03-16 MED ORDER — HYDROCODONE-ACETAMINOPHEN 5-325 MG PO TABS
1.0000 | ORAL_TABLET | Freq: Four times a day (QID) | ORAL | Status: DC | PRN
Start: 2014-03-16 — End: 2014-05-16

## 2014-03-16 NOTE — ED Provider Notes (Signed)
CSN: [id number]     Arrival date & time 03/16/14  1122 History   First MD Initiated Contact with Patient 03/16/14 1128     Chief Complaint  Patient presents with  . Fall   (Consider location/radiation/quality/duration/timing/severity/associated sxs/prior Treatment) Patient is a 57 y.o. female presenting with fall. The history is provided by the patient.  Fall This is a new problem. The current episode started less than 1 hour ago. The problem has been gradually worsening. The symptoms are aggravated by bending.    Past Medical History  Diagnosis Date  . Medical history non-contributory   . Snoring     loud, no sleep study in past   Past Surgical History  Procedure Laterality Date  . Breast surgery Bilateral 2000    augmentation  . Colonoscopy with propofol N/A 02/11/2014    Procedure: COLONOSCOPY WITH PROPOFOL;  Surgeon: Garlan Fair, MD;  Location: [facility];  Service: Endoscopy;  Laterality: N/A;   No family history on file. History  Substance Use Topics  . Smoking status: Never Smoker   . Smokeless tobacco: Never Used  . Alcohol Use: No   OB History   Grav Para Term Preterm Abortions TAB SAB Ect Mult Living                 Review of Systems  Constitutional: Negative.   Musculoskeletal: Positive for joint swelling and myalgias.  Skin: Negative.     Allergies  Other  Home Medications   Prior to Admission medications   Medication Sig Start Date End Date Taking? Authorizing Provider  HYDROcodone-acetaminophen (NORCO/VICODIN) 5-325 MG per tablet Take 1 tablet by mouth every 6 (six) hours as needed for moderate pain. 03/16/14   Billy Fischer, MD  rosuvastatin (CRESTOR) 40 MG tablet Take 40 mg by mouth every morning.     Historical Provider, MD   BP 132/68  Pulse 70  Temp(Src) 98.8 F (37.1 C) (Oral)  Resp 16  SpO2 100% Physical Exam  Nursing note and vitals reviewed. Constitutional: She is oriented to person, place, and time. She appears well-developed  and well-nourished.  HENT:  Head: Normocephalic and atraumatic.  Neck: Normal range of motion. Neck supple.  Musculoskeletal: She exhibits tenderness.       Right elbow: She exhibits decreased range of motion. She exhibits no deformity. Tenderness found. Radial head and medial epicondyle tenderness noted.       Right wrist: She exhibits decreased range of motion, tenderness and swelling. She exhibits no crepitus and no deformity.  Neurological: She is alert and oriented to person, place, and time.  Skin: Skin is warm and dry.    ED Course  Procedures (including critical care time) Labs Review Labs Reviewed - No data to display  Imaging Review Dg Elbow Complete Right  03/16/2014   CLINICAL DATA:  Fall, right elbow pain  EXAM: RIGHT ELBOW - COMPLETE 3+ VIEW  COMPARISON:  None.  FINDINGS: There is a small joint effusion with abnormal soft tissue density of the anterior fat pad. There is minimal cortical discontinuity likely indicating a nondisplaced fracture of the radial head. No radiopaque foreign body. No angulation or significant displacement.  IMPRESSION: Probable nondisplaced radial head fracture.   Electronically Signed   By: Conchita Paris M.D.   On: 03/16/2014 12:23   Dg Wrist Complete Right  03/16/2014   CLINICAL DATA:  Fall, wrist pain  EXAM: RIGHT WRIST - COMPLETE 3+ VIEW  COMPARISON:  Elbow radiographs same date, please see report  dictated separately  FINDINGS: There is no evidence of fracture or dislocation. There is no evidence of arthropathy or other focal bone abnormality. Soft tissues are unremarkable.  IMPRESSION: Negative.   Electronically Signed   By: Conchita Paris M.D.   On: 03/16/2014 12:24   X-rays reviewed and report per radiologist    MDM   1. Radial head fracture, closed, right, initial encounter        Billy Fischer, MD 03/16/14 1932

## 2014-03-16 NOTE — Progress Notes (Signed)
Orthopedic Tech Progress Note Patient Details:  Candice [name] [birth year] [id number] [employer] Type of Ortho Device: Ace wrap;Post (long arm) splint;Arm sling Ortho Device/Splint Location: RUE Ortho Device/Splint Interventions: Application   [name] 03/16/2014, 1:17 PM

## 2014-03-16 NOTE — Discharge Instructions (Signed)
Ice, sling and pain medication as needed.call to see dr Burney Gauze on thurs., sooner if any problems.

## 2014-03-16 NOTE — ED Notes (Addendum)
Patient c/o fall about 40 minutes ago at the grocery store. Patient reports she is unsure if she hit her wrist but feels pain in right elbow and wrist. Elbow hurts worst than wrist. Elbow hurts more with movement. Patient is alert and oriented and in no acute distress.

## 2014-05-14 ENCOUNTER — Other Ambulatory Visit (INDEPENDENT_AMBULATORY_CARE_PROVIDER_SITE_OTHER): Payer: BC Managed Care – PPO

## 2014-05-14 ENCOUNTER — Other Ambulatory Visit: Payer: Self-pay | Admitting: Pharmacist

## 2014-05-14 DIAGNOSIS — E785 Hyperlipidemia, unspecified: Secondary | ICD-10-CM

## 2014-05-14 LAB — LIPID PANEL
Cholesterol: 234 mg/dL — ABNORMAL HIGH (ref 0–200)
HDL: 64.4 mg/dL (ref >39.00)
LDL Cholesterol: 155 mg/dL — ABNORMAL HIGH (ref 0–99)
NonHDL: 169.6
Total CHOL/HDL Ratio: 4
Triglycerides: 75 mg/dL (ref 0.0–149.0)
VLDL: 15 mg/dL (ref 0.0–40.0)

## 2014-05-14 LAB — HEPATIC FUNCTION PANEL
ALT: 26 U/L (ref 0–35)
AST: 22 U/L (ref 0–37)
Albumin: 4 g/dL (ref 3.5–5.2)
Alkaline Phosphatase: 54 U/L (ref 39–117)
Bilirubin, Direct: 0.1 mg/dL (ref 0.0–0.3)
Total Bilirubin: 0.8 mg/dL (ref 0.2–1.2)
Total Protein: 7.2 g/dL (ref 6.0–8.3)

## 2014-05-16 ENCOUNTER — Ambulatory Visit (INDEPENDENT_AMBULATORY_CARE_PROVIDER_SITE_OTHER): Payer: BC Managed Care – PPO | Admitting: Pharmacist

## 2014-05-16 DIAGNOSIS — E7800 Pure hypercholesterolemia, unspecified: Secondary | ICD-10-CM

## 2014-05-16 MED ORDER — ROSUVASTATIN CALCIUM 40 MG PO TABS: 40.0000 mg | ORAL_TABLET | Freq: Every morning | ORAL | Status: DC

## 2014-05-16 NOTE — Patient Instructions (Signed)
Continue Crestor 40mg  daily  Start Welchol 6 tablets daily  If you have any problems, call Gay Filler at [phone]

## 2014-05-19 NOTE — Progress Notes (Signed)
Candice [name] is here for follow up of her hyperlipidemia.  She is currently taking Crestor 40 mg daily and reports no issues with this regimen.  Since her last visit, pt reports a 20lb weight loss.  She has been following weight watchers diet and wearing a fit bit to help track her exercise better.  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  Pt reports she has been on cholesterol medications since she was 17 (started with cholestyramine)  Family History: Dad died of an MI in his 34s.   Current Medications: Crestor 40mg  daily  Past Intolerances: Zetia (myalgias)  Current Labs (05/14/14): TC- 234, LDL- 155, TG- 75, HDL- 64.   Physical exam negative for tendon xanthomas or corneal arcus.    Current Outpatient Prescriptions  Medication Sig Dispense Refill  . rosuvastatin (CRESTOR) 40 MG tablet Take 1 tablet (40 mg total) by mouth every morning.  90 tablet  1   No current facility-administered medications for this visit.   Allergies  Allergen Reactions  . Other Other (See Comments)    Terrimycin=rash as child

## 2014-05-19 NOTE — Assessment & Plan Note (Signed)
Based on pt's history and LDL of 155 on Crestor 40mg , pt likely has FH but unable to verify based on [place] Scoring Criteria because we do not know what her baseline LDL is.  She has been on medication to treat her cholesterol since the age of 48.  Will need additional LDL lowering therapy.  She has failed Zetia and does not want to retry at this time. Will start with Welchol 625mg - 6 tablets daily.  If pt able to tolerate, will recheck labs in 3 months.  If her cholesterol remains elevated, may need to consider [facility].

## 2014-05-27 ENCOUNTER — Encounter: Payer: Self-pay | Admitting: General Surgery

## 2014-06-09 ENCOUNTER — Encounter: Payer: Self-pay | Admitting: *Deleted

## 2014-06-10 ENCOUNTER — Ambulatory Visit (INDEPENDENT_AMBULATORY_CARE_PROVIDER_SITE_OTHER): Payer: BC Managed Care – PPO | Admitting: Cardiology

## 2014-06-10 ENCOUNTER — Encounter: Payer: Self-pay | Admitting: Cardiology

## 2014-06-10 VITALS — BP 124/74 | HR 69 | Ht 65.0 in | Wt 173.0 lb

## 2014-06-10 DIAGNOSIS — R002 Palpitations: Secondary | ICD-10-CM | POA: Diagnosis not present

## 2014-06-10 DIAGNOSIS — E7800 Pure hypercholesterolemia, unspecified: Secondary | ICD-10-CM

## 2014-06-10 DIAGNOSIS — R0789 Other chest pain: Secondary | ICD-10-CM | POA: Diagnosis not present

## 2014-06-10 DIAGNOSIS — Z8249 Family history of ischemic heart disease and other diseases of the circulatory system: Secondary | ICD-10-CM

## 2014-06-10 LAB — COMPREHENSIVE METABOLIC PANEL
ALT: 22 U/L (ref 0–35)
AST: 20 U/L (ref 0–37)
Albumin: 3.7 g/dL (ref 3.5–5.2)
Alkaline Phosphatase: 58 U/L (ref 39–117)
BUN: 17 mg/dL (ref 6–23)
CO2: 28 mEq/L (ref 19–32)
Calcium: 9.6 mg/dL (ref 8.4–10.5)
Chloride: 103 mEq/L (ref 96–112)
Creatinine, Ser: 1 mg/dL (ref 0.4–1.2)
GFR: 62.32 mL/min (ref >60.00)
Glucose, Bld: 84 mg/dL (ref 70–99)
Potassium: 4.5 mEq/L (ref 3.5–5.1)
Sodium: 138 mEq/L (ref 135–145)
Total Bilirubin: 0.8 mg/dL (ref 0.2–1.2)
Total Protein: 7.4 g/dL (ref 6.0–8.3)

## 2014-06-10 LAB — CBC WITH DIFFERENTIAL/PLATELET
Basophils Absolute: 0 10*3/uL (ref 0.0–0.1)
Basophils Relative: 0.5 % (ref 0.0–3.0)
Eosinophils Absolute: 0.1 10*3/uL (ref 0.0–0.7)
Eosinophils Relative: 1.1 % (ref 0.0–5.0)
HCT: 42 % (ref 36.0–46.0)
Hemoglobin: 13.9 g/dL (ref 12.0–15.0)
Lymphocytes Relative: 30.9 % (ref 12.0–46.0)
Lymphs Abs: 2.1 10*3/uL (ref 0.7–4.0)
MCHC: 33 g/dL (ref 30.0–36.0)
MCV: 91.3 fl (ref 78.0–100.0)
Monocytes Absolute: 0.5 10*3/uL (ref 0.1–1.0)
Monocytes Relative: 7.3 % (ref 3.0–12.0)
Neutro Abs: 4.1 10*3/uL (ref 1.4–7.7)
Neutrophils Relative %: 60.2 % (ref 43.0–77.0)
Platelets: 316 10*3/uL (ref 150.0–400.0)
RBC: 4.6 Mil/uL (ref 3.87–5.11)
RDW: 13.8 % (ref 11.5–15.5)
WBC: 6.9 10*3/uL (ref 4.0–10.5)

## 2014-06-10 LAB — HEMOGLOBIN A1C: Hgb A1c MFr Bld: 5.2 % (ref 4.6–6.5)

## 2014-06-10 LAB — TSH: TSH: 0.88 u[IU]/mL (ref 0.35–4.50)

## 2014-06-10 NOTE — Progress Notes (Signed)
Patient ID: Candice [name], female   DOB: January 17, 1956, 58 y.o.   MRN: [id number]    Patient Name: Candice [name] Date of Encounter: 06/10/2014  Primary Care Provider:  Gwendolyn Grant, MD Primary Cardiologist:  Dorothy Spark  Problem List   Past Medical History  Diagnosis Date  . Medical history non-contributory   . Snoring     loud, no sleep study in past  . Hyperlipidemia     LDL goal less than 70, followed by [facility]  . Chronic kidney disease     stage three Dr Posey Pronto   Past Surgical History  Procedure Laterality Date  . Breast surgery Bilateral 2000    augmentation  . Colonoscopy with propofol N/A 02/11/2014    Procedure: COLONOSCOPY WITH PROPOFOL;  Surgeon: Garlan Fair, MD;  Location: [facility];  Service: Endoscopy;  Laterality: N/A;    Allergies  Allergies  Allergen Reactions  . Other Other (See Comments)    Terrimycin=rash as child  . Prozac [Fluoxetine]     Suicidal thoughts    HPI  A very pleasant 58 year old female who has been followed by our lipid clinic for many years for hyperlipidemia, currently treated with Crestor 40 mg daily. She is very compliant to her medication and has no side effects. The patient has a very significant family history of coronary artery disease, her father died of myocardial infarction at age of 75, her paternal grandfather died of myocardial infarction at age of 19. Her half siblings and her mother are healthy. She has a stressful job and now terminates healthy diet with an healthy when she is stressed. Goes the same with exercise she tries to exercise 3-4 times a week, mostly walking but sometimes performs more strain or exercise. She admits to not exercising much lately because of stress. She has occasional chest pains mostly when she lays down in bed, she actually feels better when she exercises and denies any chest pain or shortness of breath. She has occasional second lasting palpitations are not associated  with any other symptoms. No syncope. She recently underwent mammogram colonoscopy and Pap smear all normal. She is postmenopausal with last period in 2010.  Home Medications  Prior to Admission medications   Medication Sig Start Date End Date Taking? Authorizing Provider  rosuvastatin (CRESTOR) 40 MG tablet Take 1 tablet (40 mg total) by mouth every morning. 05/16/14   Dorothy Spark, MD   Family History  Family History  Problem Relation Age of Onset  . Colon cancer Paternal Aunt   . CAD Paternal Grandfather   . Osteoarthritis Mother   . Heart attack Father 40  . Alcohol abuse Father     smoker   Social History  History   Social History  . Marital Status: Divorced    Spouse Name: N/A    Number of Children: N/A  . Years of Education: N/A   Occupational History  . Not on file.   Social History Main Topics  . Smoking status: Never Smoker   . Smokeless tobacco: Never Used  . Alcohol Use: Yes  . Drug Use: No  . Sexual Activity: Not on file   Other Topics Concern  . Not on file   Social History Narrative  . No narrative on file    Review of Systems, as per HPI, otherwise negative General:  No chills, fever, night sweats or weight changes.  Cardiovascular:  No chest pain, dyspnea on exertion, edema, orthopnea, palpitations, paroxysmal nocturnal  dyspnea. Dermatological: No rash, lesions/masses Respiratory: No cough, dyspnea Urologic: No hematuria, dysuria Abdominal:   No nausea, vomiting, diarrhea, bright red blood per rectum, melena, or hematemesis Neurologic:  No visual changes, wkns, changes in mental status. All other systems reviewed and are otherwise negative except as noted above.  Physical Exam  Blood pressure 124/74, pulse 69, height _0  (1.651 m), weight 173 lb (78.472 kg).  General: Pleasant, NAD Psych: Normal affect. Neuro: Alert and oriented X 3. Moves all extremities spontaneously. HEENT: Normal  Neck: Supple without bruits or JVD. Lungs:  Resp  regular and unlabored, CTA. Heart: RRR no s3, s4, or murmurs. Abdomen: Soft, non-tender, non-distended, BS + x 4.  Extremities: No clubbing, cyanosis or edema. DP/PT/Radials 2+ and equal bilaterally.  Labs:  No results found for this basename: CKTOTAL, CKMB, TROPONINI,  in the last 72 hours Lab Results  Component Value Date   WBC 8.1 02/08/2010   HGB 13.8 02/08/2010   HCT 39.7 02/08/2010   MCV 90.9 02/08/2010   PLT 269 02/08/2010    Lab Results  Component Value Date   DDIMER  Value: <0.22        AT THE INHOUSE ESTABLISHED CUTOFF VALUE OF 0.48 ug/mL FEU, THIS ASSAY HAS BEEN DOCUMENTED IN THE LITERATURE TO HAVE A SENSITIVITY AND NEGATIVE PREDICTIVE VALUE OF AT LEAST 98 TO 99%.  THE TEST RESULT SHOULD BE CORRELATED WITH AN ASSESSMENT OF THE CLINICAL PROBABILITY OF DVT / VTE. 02/08/2010   No components found with this basename: POCBNP,     Component Value Date/Time   NA 143 02/08/2010 2050   K 3.6 02/08/2010 2050   CL 108 02/08/2010 2050   CO2 28 02/08/2010 2050   GLUCOSE 88 02/08/2010 2050   BUN 10 02/08/2010 2050   CREATININE 0.86 02/08/2010 2050   CALCIUM 9.6 02/08/2010 2050   PROT 7.2 05/14/2014 0755   ALBUMIN 4.0 05/14/2014 0755   AST 22 05/14/2014 0755   ALT 26 05/14/2014 0755   ALKPHOS 54 05/14/2014 0755   BILITOT 0.8 05/14/2014 0755   GFRNONAA >60 02/08/2010 2050   GFRAA  Value: >60        The eGFR has been calculated using the MDRD equation. This calculation has not been validated in all clinical situations. eGFR's persistently <60 mL/min signify possible Chronic Kidney Disease. 02/08/2010 2050   Lab Results  Component Value Date   CHOL 234* 05/14/2014   HDL 64.40 05/14/2014   LDLCALC 155* 05/14/2014   TRIG 75.0 05/14/2014   Accessory Clinical Findings  Echocardiogram - none  ECG -SR, normal ECG    Assessment & Plan  58 year old female  1. Occasional non-exertional chest pain - most probably GERD, only when lying flat. Feels better on exertion - no additional work up  needed  2. BP -controlled  3. Lipids - LDL 155 on highest dose of Crestor, follows in lipid clinic  4. Diabetes screening - send HbA1c  5. Palpitations - we will check CMP, CBC, TSH  Follow u in 1 year    Dorothy Spark, MD, [facility] 06/10/2014, 11:07 AM

## 2014-06-10 NOTE — Patient Instructions (Signed)
Your physician recommends that you continue on your current medications as directed. Please refer to the Current Medication list given to you today.   Your physician recommends that you return for lab work in: TODAY (CMET, CBC W DIFF, TSH, AND HGBA1C)   Your physician wants you to follow-up in: [place] will receive a reminder letter in the mail two months in advance. If you don't receive a letter, please call our office to schedule the follow-up appointment.

## 2014-06-11 ENCOUNTER — Telehealth: Payer: Self-pay | Admitting: Cardiology

## 2014-06-11 NOTE — Telephone Encounter (Signed)
LMTCB X2

## 2014-06-11 NOTE — Telephone Encounter (Signed)
Pt notified of normal electrolytes, normal creatinine, normal cbc w diff, normal LFTs, normal TSH, and normal HGBA1C per Dr Meda Coffee.  Pt verbalized understanding and pleased with this news

## 2014-06-11 NOTE — Telephone Encounter (Signed)
Patient is returning your call please call and advise.

## 2014-12-21 IMAGING — CR DG ELBOW COMPLETE 3+V*R*
4 series · 4 of 4 positions shown · non-contrast
Comparison: None.

CLINICAL DATA: Fall, right elbow pain

EXAM:
RIGHT ELBOW - COMPLETE 3+ VIEW

[view not recorded (1 of 4)]
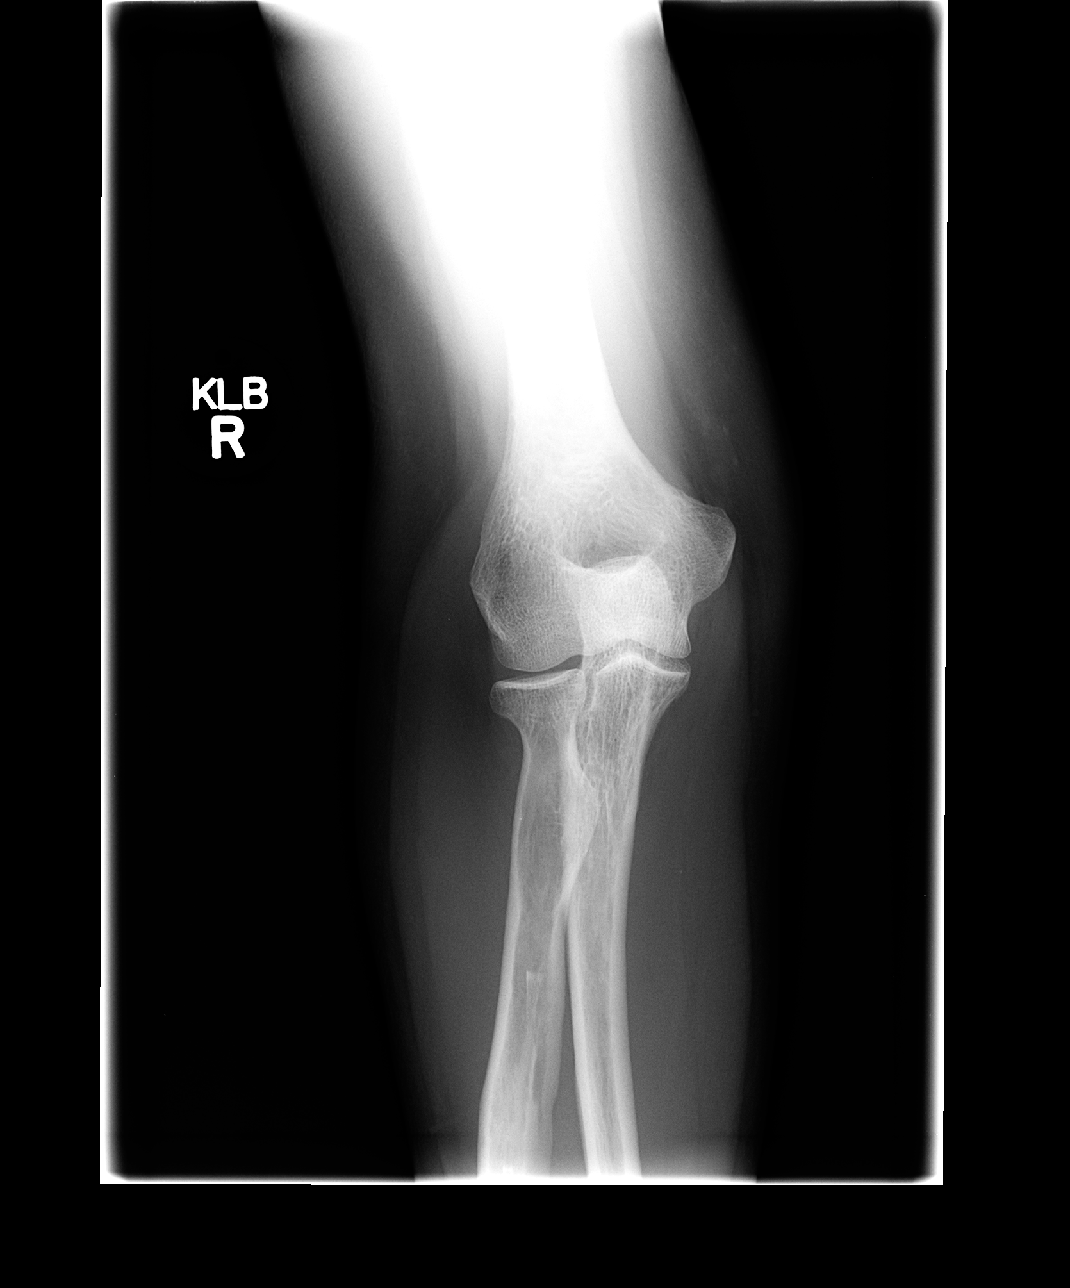

[view not recorded (2 of 4)]
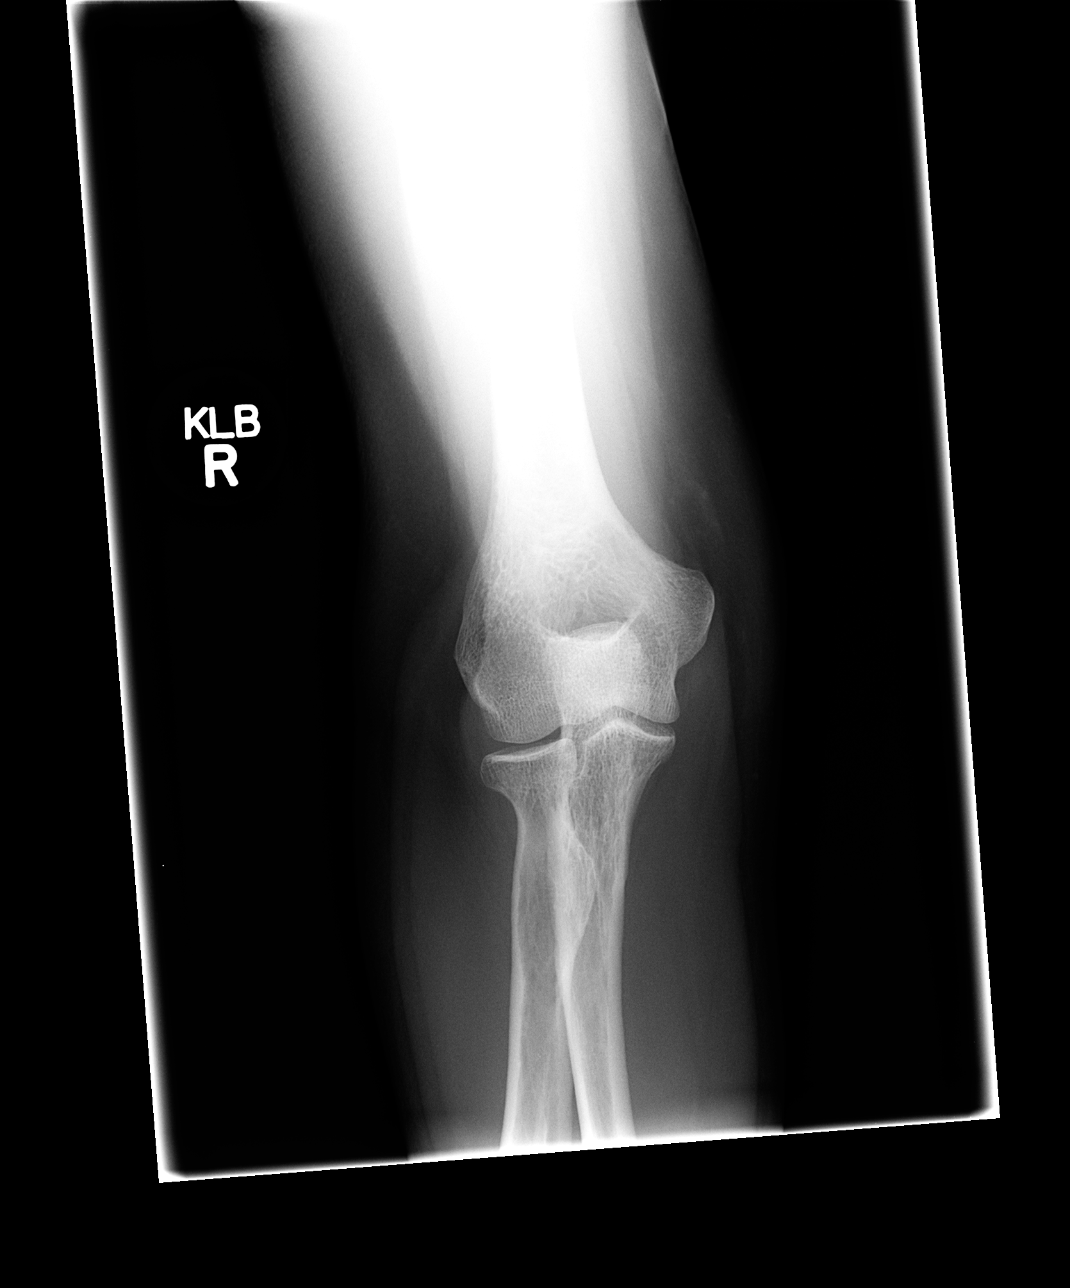

[view not recorded (3 of 4)]
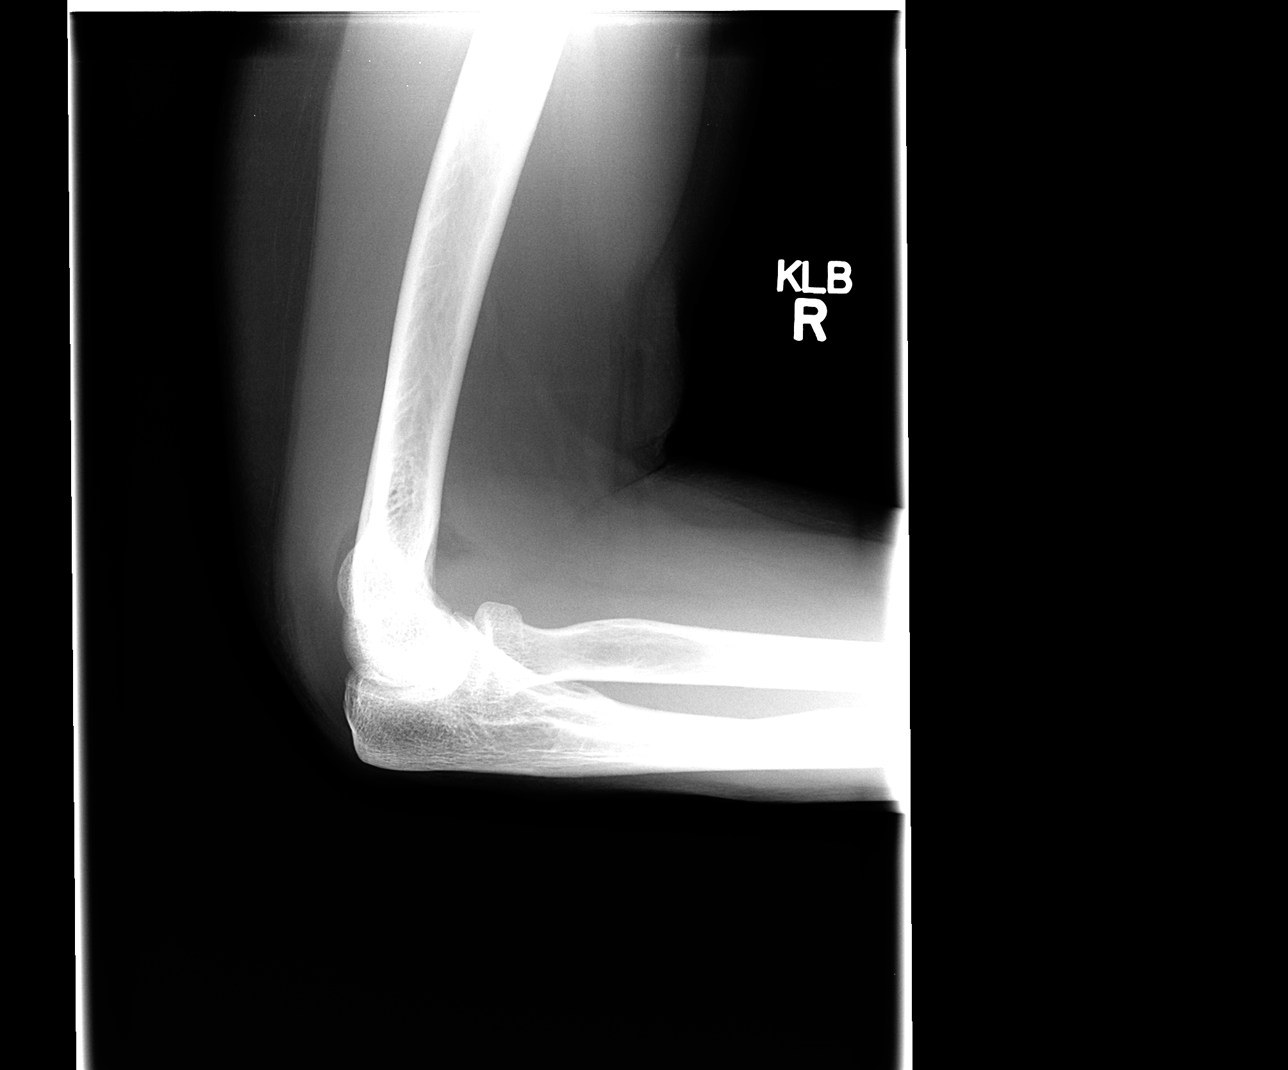

[view not recorded (4 of 4)]
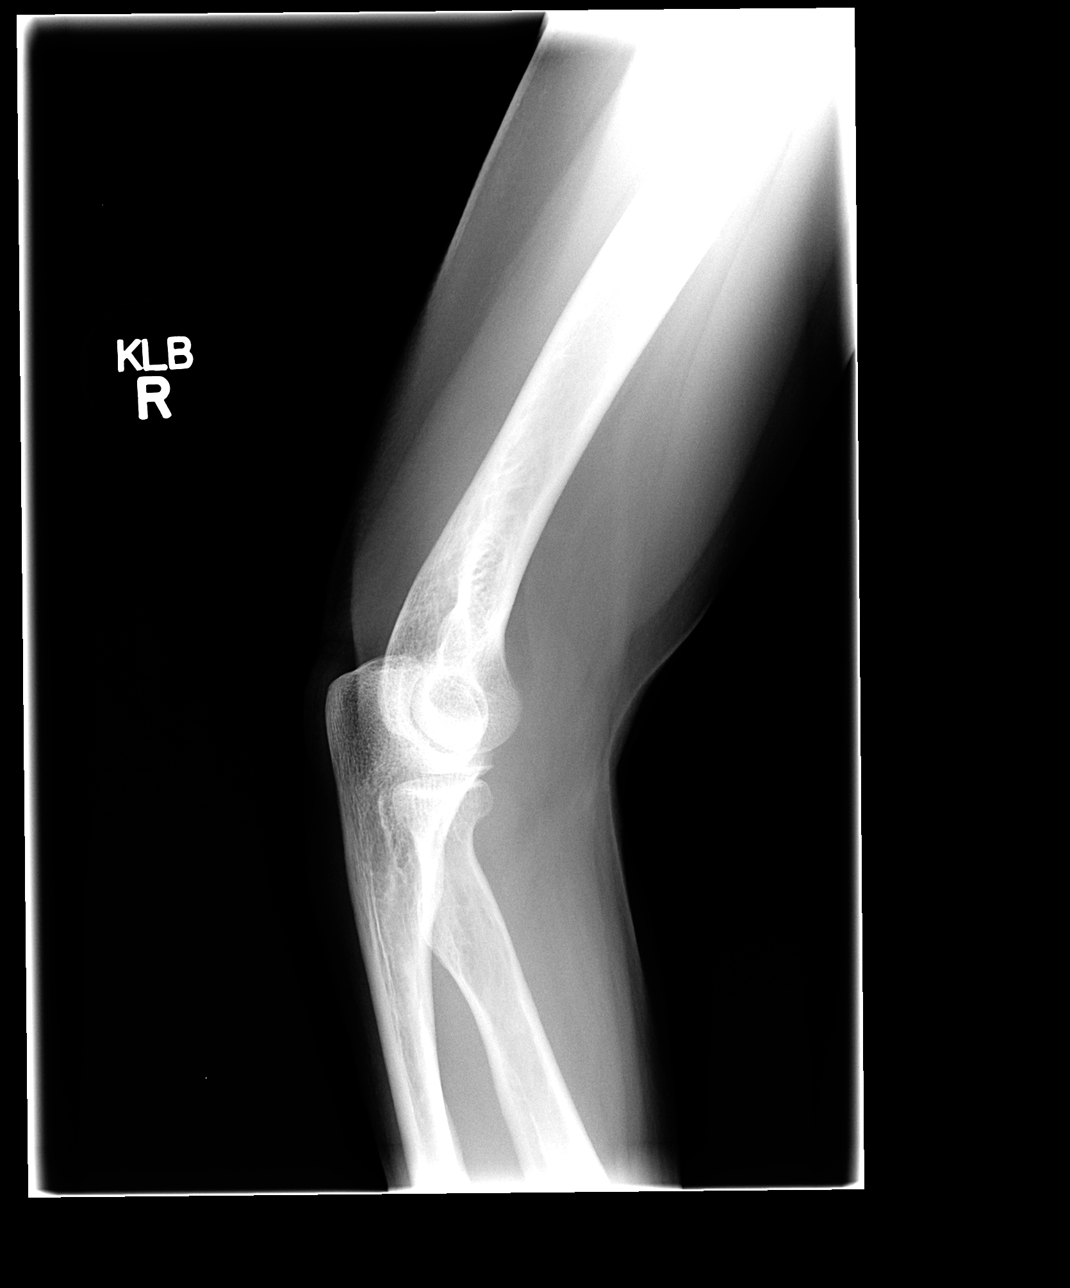

[4 of 4 positions shown; findings below may reference images not displayed]

FINDINGS: There is a small joint effusion with abnormal soft tissue density of
the anterior fat pad. There is minimal cortical discontinuity likely
indicating a nondisplaced fracture of the radial head. No radiopaque
foreign body. No angulation or significant displacement.
IMPRESSION: Probable nondisplaced radial head fracture.

## 2015-04-06 ENCOUNTER — Other Ambulatory Visit: Payer: Self-pay | Admitting: *Deleted

## 2015-04-06 ENCOUNTER — Other Ambulatory Visit: Payer: Self-pay | Admitting: Pharmacist

## 2015-04-06 DIAGNOSIS — E7800 Pure hypercholesterolemia, unspecified: Secondary | ICD-10-CM

## 2015-04-06 MED ORDER — ROSUVASTATIN CALCIUM 40 MG PO TABS: 40.0000 mg | ORAL_TABLET | Freq: Every morning | ORAL | Status: DC

## 2015-04-13 ENCOUNTER — Other Ambulatory Visit (INDEPENDENT_AMBULATORY_CARE_PROVIDER_SITE_OTHER): Payer: BLUE CROSS/BLUE SHIELD | Admitting: *Deleted

## 2015-04-13 DIAGNOSIS — E7800 Pure hypercholesterolemia, unspecified: Secondary | ICD-10-CM

## 2015-04-13 LAB — LIPID PANEL
Cholesterol: 261 mg/dL — ABNORMAL HIGH (ref 0–200)
HDL: 48.8 mg/dL (ref >39.00)
LDL Cholesterol: 187 mg/dL — ABNORMAL HIGH (ref 0–99)
NonHDL: 211.87
Total CHOL/HDL Ratio: 5
Triglycerides: 123 mg/dL (ref 0.0–149.0)
VLDL: 24.6 mg/dL (ref 0.0–40.0)

## 2015-04-13 LAB — HEPATIC FUNCTION PANEL
ALT: 24 U/L (ref 0–35)
AST: 18 U/L (ref 0–37)
Albumin: 3.9 g/dL (ref 3.5–5.2)
Alkaline Phosphatase: 59 U/L (ref 39–117)
Bilirubin, Direct: 0 mg/dL (ref 0.0–0.3)
Total Bilirubin: 0.6 mg/dL (ref 0.2–1.2)
Total Protein: 6.9 g/dL (ref 6.0–8.3)

## 2015-04-16 ENCOUNTER — Ambulatory Visit (INDEPENDENT_AMBULATORY_CARE_PROVIDER_SITE_OTHER): Payer: BLUE CROSS/BLUE SHIELD | Admitting: Pharmacist

## 2015-04-16 DIAGNOSIS — Z8249 Family history of ischemic heart disease and other diseases of the circulatory system: Secondary | ICD-10-CM | POA: Diagnosis not present

## 2015-04-16 DIAGNOSIS — E7800 Pure hypercholesterolemia, unspecified: Secondary | ICD-10-CM

## 2015-04-16 DIAGNOSIS — R0789 Other chest pain: Secondary | ICD-10-CM | POA: Diagnosis not present

## 2015-04-16 NOTE — Patient Instructions (Signed)
Praluent and Repatha are the 2 new injectable medications.   We will call you with a date for your cardiac CT.

## 2015-04-21 ENCOUNTER — Encounter: Payer: Self-pay | Admitting: Cardiology

## 2015-04-27 ENCOUNTER — Encounter (HOSPITAL_COMMUNITY): Payer: Self-pay

## 2015-04-27 ENCOUNTER — Ambulatory Visit (HOSPITAL_COMMUNITY)
Admission: RE | Admit: 2015-04-27 | Discharge: 2015-04-27 | Disposition: A | Discharge status: Home / Self Care | Payer: BLUE CROSS/BLUE SHIELD | Source: Ambulatory Visit | Attending: Cardiology | Admitting: Cardiology

## 2015-04-27 DIAGNOSIS — R0789 Other chest pain: Secondary | ICD-10-CM | POA: Diagnosis not present

## 2015-04-27 DIAGNOSIS — E784 Other hyperlipidemia: Secondary | ICD-10-CM | POA: Insufficient documentation

## 2015-04-27 DIAGNOSIS — Z8249 Family history of ischemic heart disease and other diseases of the circulatory system: Secondary | ICD-10-CM | POA: Diagnosis not present

## 2015-04-27 DIAGNOSIS — I2510 Atherosclerotic heart disease of native coronary artery without angina pectoris: Secondary | ICD-10-CM | POA: Diagnosis not present

## 2015-04-27 DIAGNOSIS — E7800 Pure hypercholesterolemia, unspecified: Secondary | ICD-10-CM

## 2015-04-27 MED ORDER — METOPROLOL TARTRATE 1 MG/ML IV SOLN
5.0000 mg | Freq: Once | INTRAVENOUS | Status: AC
  Administered 2015-04-27: 5 mg via INTRAVENOUS
  Filled 2015-04-27: qty 5

## 2015-04-27 MED ORDER — NITROGLYCERIN 0.4 MG SL SUBL
SUBLINGUAL_TABLET | SUBLINGUAL | Status: AC
  Administered 2015-04-27: 0.4 mg via SUBLINGUAL
  Filled 2015-04-27: qty 1

## 2015-04-27 MED ORDER — NITROGLYCERIN 0.4 MG SL SUBL
0.4000 mg | SUBLINGUAL_TABLET | SUBLINGUAL | Status: DC | PRN
  Administered 2015-04-27: 0.4 mg via SUBLINGUAL
  Filled 2015-04-27: qty 25

## 2015-04-27 MED ORDER — METOPROLOL TARTRATE 1 MG/ML IV SOLN
INTRAVENOUS | Status: AC
Start: 2015-04-27 — End: 2015-04-27
  Administered 2015-04-27: 5 mg via INTRAVENOUS
  Filled 2015-04-27: qty 5

## 2015-04-27 MED ORDER — METOPROLOL TARTRATE 1 MG/ML IV SOLN
INTRAVENOUS | Status: AC
  Filled 2015-04-27: qty 5

## 2015-04-27 MED ORDER — IOHEXOL 350 MG/ML SOLN
80.0000 mL | Freq: Once | INTRAVENOUS | Status: AC | PRN
  Administered 2015-04-27: 80 mL via INTRAVENOUS

## 2015-04-27 NOTE — Progress Notes (Signed)
    HPI [name] is here for follow up of her hyperlipidemia.  She is currently taking Crestor 40 mg daily and reports no issues with this regimen.  Since her last visit, she reports not doing as well with her diet and exercise routine.  She has been under more stress and admits she is a stress eater.  She is hoping to get back to [employer] soon. 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  Pt reports she has been on cholesterol medications since she was 17 (started with cholestyramine)  Family History: Dad died of an MI in his 98s.   Current Medications: Crestor 40mg  daily  Past Intolerances: Zetia (myalgias)  Labs  04/13/15: TC- 261, LDL- 187, TG 123, HDL 49 LFTS normal 05/14/14: TC- 234, LDL- 155, TG- 75, HDL- 64.   Physical exam negative for tendon xanthomas or corneal arcus.   Current Outpatient Prescriptions on File Prior to Visit  Medication Sig Dispense Refill  . rosuvastatin (CRESTOR) 40 MG tablet Take 1 tablet (40 mg total) by mouth every morning. 30 tablet 0   No current facility-administered medications on file prior to visit.    Allergies  Allergen Reactions  . Other Other (See Comments)    Terrimycin=rash as child  . Prozac [Fluoxetine]     Suicidal thoughts   Assessment and Plan 1.  Hyperlipidemia- Pt's LDL worse over the past 6 months.  She is on max dose statin and unable to take Zetia.  Unfortunately we do not know what her true baseline LDL is to diagnose her as FH, but would assume >300 mg/dL.  She has never had any diagnostic testing done to check for CAD.  She is interested in having a coronary CT scan done.  Will set up with Dr. Meda Coffee.  If she has significant blockage, may be able to qualify for PCSK-9.  If not, will continue with Crestor 40mg  daily and encourage lifestyle improvements.

## 2015-05-10 ENCOUNTER — Other Ambulatory Visit: Payer: Self-pay | Admitting: Cardiology

## 2015-05-11 ENCOUNTER — Other Ambulatory Visit: Payer: Self-pay | Admitting: Pharmacist

## 2015-05-11 DIAGNOSIS — E7800 Pure hypercholesterolemia, unspecified: Secondary | ICD-10-CM

## 2015-05-11 MED ORDER — ROSUVASTATIN CALCIUM 40 MG PO TABS: 40.0000 mg | ORAL_TABLET | Freq: Every morning | ORAL | Status: DC

## 2015-05-15 ENCOUNTER — Ambulatory Visit: Payer: Self-pay | Admitting: Cardiology

## 2015-05-25 ENCOUNTER — Encounter: Payer: Self-pay | Admitting: Cardiology

## 2015-05-25 ENCOUNTER — Ambulatory Visit (INDEPENDENT_AMBULATORY_CARE_PROVIDER_SITE_OTHER): Payer: BLUE CROSS/BLUE SHIELD | Admitting: Cardiology

## 2015-05-25 VITALS — BP 124/80 | HR 67 | Ht 65.0 in | Wt 209.0 lb

## 2015-05-25 DIAGNOSIS — E7849 Other hyperlipidemia: Secondary | ICD-10-CM

## 2015-05-25 DIAGNOSIS — I2510 Atherosclerotic heart disease of native coronary artery without angina pectoris: Secondary | ICD-10-CM

## 2015-05-25 DIAGNOSIS — E785 Hyperlipidemia, unspecified: Secondary | ICD-10-CM | POA: Diagnosis not present

## 2015-05-25 DIAGNOSIS — I2583 Coronary atherosclerosis due to lipid rich plaque: Secondary | ICD-10-CM

## 2015-05-25 DIAGNOSIS — R072 Precordial pain: Secondary | ICD-10-CM | POA: Diagnosis not present

## 2015-05-25 NOTE — Patient Instructions (Signed)
Medication Instructions:  Your physician has recommended you make the following change in your medication:  STOP Crestor   Labwork: Your physician recommends that you return for a FASTING lipid profile and cmet on 07/06/15   Testing/Procedures: None ordered  Follow-Up: Your physician wants you to follow-up in: 1 year with Dr.Nelson You will receive a reminder letter in the mail two months in advance. If you don't receive a letter, please call our office to schedule the follow-up appointment.    Any Other Special Instructions Will Be Listed Below (If Applicable).

## 2015-05-25 NOTE — Progress Notes (Signed)
Patient ID: CHELLE CAYTON, female   DOB: [birth year], 59 y.o.   MRN: [id number]      HPI Ms. Orrego is here for follow up of her hyperlipidemia.  She is currently taking Crestor 40 mg daily and reports no issues with this regimen.  Since her last visit, she reports not doing as well with her diet and exercise routine.  She has been under more stress and admits she is a stress eater.  She is hoping to get back to [employer] soon. 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  Pt reports she has been on cholesterol medications since she was 17 (started with cholestyramine) Family History: Dad died of an MI in his 10s.  Current Medications: Crestor 40mg  daily Past Intolerances: Zetia (myalgias)  05/25/15 - the patient is being seen by the lipid clinic, continues to have significantly elevated LDL, coronary CT showed mild non-obstructive CAD including a calcified plaque in the left main. She denies any chest pain, DOE, palpitations or syncope.  Labs  04/13/15: TC- 261, LDL- 187, TG 123, HDL 49 LFTS normal 05/14/14: TC- 234, LDL- 155, TG- 75, HDL- 64.   Physical exam negative for tendon xanthomas or corneal arcus.   Current Outpatient Prescriptions on File Prior to Visit  Medication Sig Dispense Refill  . rosuvastatin (CRESTOR) 40 MG tablet Take 1 tablet (40 mg total) by mouth every morning. 30 tablet 11   No current facility-administered medications on file prior to visit.    Allergies  Allergen Reactions  . Other Other (See Comments)    Terrimycin=rash as child  . Prozac [Fluoxetine]     Suicidal thoughts     Assessment and Plan  59 year old female  1. Occasional non-exertional chest pain - most probably GERD, only when lying flat. Feels better on exertion - Coronary CT showed mild non-obstructive CAD including a calcified plaque in the left main. We will proceed with an aggressive medical management.  2. BP -controlled  3. Lipids - LDL 155  on highest dose of Crestor, follows in lipid clinic:  "Pt's LDL worse over the past 6 months.  She is on max dose statin and unable to take Zetia.  Unfortunately we do not know what her true baseline LDL is to diagnose her as FH, but would assume >300 mg/dL.  She has never had any diagnostic testing done to check for CAD.  She is interested in having a coronary CT scan done.  Will set up with Dr. Meda Coffee.  If she has significant blockage, may be able to qualify for PCSK-9.  If not, will continue with Crestor 40mg  daily and encourage lifestyle improvements."  Coronary CT showed mild non-obstructive CAD including a calcified plaque in the left main. We will proceed with an aggressive medical management. We will stop her crestor to evaluate her true lipids level - recheck lipid profile in 3-4 weeks and consider PCSK 9 inhibitors.   4. Diabetes screening - send HbA1c 5.2 %  5. Palpitations - normal CMP, CBC, TSH  Follow u in 1 year

## 2015-07-06 ENCOUNTER — Other Ambulatory Visit (INDEPENDENT_AMBULATORY_CARE_PROVIDER_SITE_OTHER): Payer: BLUE CROSS/BLUE SHIELD | Admitting: *Deleted

## 2015-07-06 DIAGNOSIS — E785 Hyperlipidemia, unspecified: Secondary | ICD-10-CM | POA: Diagnosis not present

## 2015-07-06 LAB — LIPID PANEL
Cholesterol: 422 mg/dL — ABNORMAL HIGH (ref 125–200)
HDL: 41 mg/dL — AB (ref >=46)
LDL CALC: 337 mg/dL — AB (ref <130)
TRIGLYCERIDES: 218 mg/dL — AB (ref <150)
Total CHOL/HDL Ratio: 10.3 Ratio — ABNORMAL HIGH (ref <=5.0)
VLDL: 44 mg/dL — AB (ref <30)

## 2015-07-06 LAB — COMPREHENSIVE METABOLIC PANEL
ALBUMIN: 3.9 g/dL (ref 3.6–5.1)
ALT: 22 U/L (ref 6–29)
AST: 16 U/L (ref 10–35)
Alkaline Phosphatase: 64 U/L (ref 33–130)
BILIRUBIN TOTAL: 0.5 mg/dL (ref 0.2–1.2)
BUN: 10 mg/dL (ref 7–25)
CALCIUM: 9.1 mg/dL (ref 8.6–10.4)
CHLORIDE: 107 mmol/L (ref 98–110)
CO2: 28 mmol/L (ref 20–31)
Creat: 0.96 mg/dL (ref 0.50–1.05)
Glucose, Bld: 81 mg/dL (ref 65–99)
Potassium: 4.6 mmol/L (ref 3.5–5.3)
Sodium: 143 mmol/L (ref 135–146)
Total Protein: 7 g/dL (ref 6.1–8.1)

## 2015-07-07 ENCOUNTER — Telehealth: Payer: Self-pay | Admitting: Pharmacist

## 2015-07-07 NOTE — Telephone Encounter (Signed)
Received pt's lab results from yesterday.  We had asked her to stop her Crestor on 05/25/15 so that we could get a baseline LDL.  Her LDL off all therapy was 337 mg/dL.  With this new information, her [place] Criteria Score is 9 (8 points for LDL >325 mg/dL and 1 point for family history of father who had MI at age 57).    She is able to tolerate Crestor 40mg  daily but LDL was still 187 mg/dL 2 months ago.  She is unable to tolerate Zetia.  At this time, best option would be to pursue PCSK-9.  She is agreeable. Will start paperwork for patient today.

## 2015-07-31 ENCOUNTER — Telehealth: Payer: Self-pay | Admitting: Pharmacist

## 2015-07-31 MED ORDER — ALIROCUMAB 75 MG/ML ~~LOC~~ SOPN
75.0000 mg | PEN_INJECTOR | SUBCUTANEOUS | Status: DC
Start: 2015-07-31 — End: 2016-03-18

## 2015-07-31 NOTE — Telephone Encounter (Signed)
Received PA approval for Praluent for pt.  She is aware an Rx has been sent to [facility].  If she does not hear from them regarding delivery she will let us know.  She would also prefer to come to the office for her first injection.

## 2015-12-31 ENCOUNTER — Other Ambulatory Visit: Payer: Self-pay

## 2015-12-31 MED ORDER — CRESTOR 40 MG PO TABS: 40.0000 mg | ORAL_TABLET | Freq: Every day | ORAL | Status: DC

## 2016-01-12 ENCOUNTER — Other Ambulatory Visit: Payer: Self-pay

## 2016-01-12 MED ORDER — ROSUVASTATIN CALCIUM 40 MG PO TABS: 40.0000 mg | ORAL_TABLET | Freq: Every day | ORAL | Status: DC

## 2016-01-21 ENCOUNTER — Other Ambulatory Visit: Payer: Self-pay | Admitting: *Deleted

## 2016-03-15 ENCOUNTER — Telehealth: Payer: Self-pay | Admitting: Cardiology

## 2016-03-15 DIAGNOSIS — E7800 Pure hypercholesterolemia, unspecified: Secondary | ICD-10-CM

## 2016-03-15 NOTE — Telephone Encounter (Signed)
Spoke with pt.  She has not had her labs checked since starting Praluent.  Will check labs this week and ensure LDL is at goal.  If not, will need to increase Praluent to 150mg  injections.  She did her injection today so will not need the refill for a few weeks.

## 2016-03-15 NOTE — Telephone Encounter (Signed)
Candice [name] is calling requesting a refill on praluent 75 mg/ml pens. Please advise

## 2016-03-18 ENCOUNTER — Other Ambulatory Visit: Payer: BLUE CROSS/BLUE SHIELD

## 2016-03-18 ENCOUNTER — Other Ambulatory Visit: Payer: Self-pay | Admitting: *Deleted

## 2016-03-18 MED ORDER — ALIROCUMAB 75 MG/ML ~~LOC~~ SOPN: 75.0000 mg | PEN_INJECTOR | SUBCUTANEOUS | 6 refills | Status: DC

## 2016-03-22 ENCOUNTER — Other Ambulatory Visit: Payer: BLUE CROSS/BLUE SHIELD | Admitting: *Deleted

## 2016-03-22 DIAGNOSIS — E7800 Pure hypercholesterolemia, unspecified: Secondary | ICD-10-CM

## 2016-03-22 LAB — HEPATIC FUNCTION PANEL
ALT: 23 U/L (ref 6–29)
AST: 17 U/L (ref 10–35)
Albumin: 4.1 g/dL (ref 3.6–5.1)
Alkaline Phosphatase: 65 U/L (ref 33–130)
Bilirubin, Direct: 0.2 mg/dL (ref <=0.2)
Indirect Bilirubin: 0.5 mg/dL (ref 0.2–1.2)
Total Bilirubin: 0.7 mg/dL (ref 0.2–1.2)
Total Protein: 6.6 g/dL (ref 6.1–8.1)

## 2016-03-22 LAB — LIPID PANEL
Cholesterol: 168 mg/dL (ref 125–200)
HDL: 67 mg/dL (ref >=46)
LDL Cholesterol: 78 mg/dL (ref <130)
Total CHOL/HDL Ratio: 2.5 Ratio (ref <=5.0)
Triglycerides: 117 mg/dL (ref <150)
VLDL: 23 mg/dL (ref <30)

## 2016-03-28 NOTE — Telephone Encounter (Signed)
LDL improved to 78 mg/dL on Praluent 75mg  and Crestor 40mg  daily.  Will not need to increase Praluent at this time.  Will complete PA for [facility].

## 2016-05-23 ENCOUNTER — Other Ambulatory Visit: Payer: Self-pay | Admitting: *Deleted

## 2016-05-23 MED ORDER — ROSUVASTATIN CALCIUM 40 MG PO TABS
40.0000 mg | ORAL_TABLET | Freq: Every day | ORAL | 0 refills | Status: DC
Start: 2016-05-23 — End: 2016-05-31

## 2016-05-31 ENCOUNTER — Other Ambulatory Visit: Payer: Self-pay | Admitting: *Deleted

## 2016-05-31 MED ORDER — CRESTOR 40 MG PO TABS: 40.0000 mg | ORAL_TABLET | Freq: Every day | ORAL | 0 refills | Status: DC

## 2016-05-31 NOTE — Telephone Encounter (Signed)
Patient called and was upset that the crestor rx was sent in to the mail order pharmacy and sent in for generic. She stated that she never gets generic and she never uses a mail order pharmacy for this medication. She would like it to be sent to [facility] for brand name only. I also made her aware that she needs an appointment which is why it was sent in for only a thirty day supply.

## 2016-06-19 DIAGNOSIS — R3915 Urgency of urination: Secondary | ICD-10-CM | POA: Diagnosis not present

## 2016-06-20 DIAGNOSIS — N3001 Acute cystitis with hematuria: Secondary | ICD-10-CM | POA: Diagnosis not present

## 2016-06-20 DIAGNOSIS — F439 Reaction to severe stress, unspecified: Secondary | ICD-10-CM | POA: Diagnosis not present

## 2016-07-29 ENCOUNTER — Encounter: Payer: Self-pay | Admitting: Cardiology

## 2016-08-18 ENCOUNTER — Telehealth: Payer: Self-pay | Admitting: Cardiology

## 2016-08-18 NOTE — Telephone Encounter (Signed)
New meessage       Pt c/o medication issue:  1. Name of Medication: praluent 2. How are you currently taking this medication (dosage and times per day)?  3. Are you having a reaction (difficulty breathing--STAT)?    4. What is your medication issue? Need prior authorization----case number [id number] complete forms that was faxed today

## 2016-08-18 NOTE — Telephone Encounter (Signed)
Did not receive a prior auth fax. Will submit online via Cover My Meds. Called patient for updated medication insurance since this has not been scanned in.  [facility]: Member ID: [id number] BIN: [id number] PCN: [id number] GRP: Maia Breslow

## 2016-08-19 ENCOUNTER — Encounter (INDEPENDENT_AMBULATORY_CARE_PROVIDER_SITE_OTHER): Payer: Self-pay

## 2016-08-19 ENCOUNTER — Ambulatory Visit (INDEPENDENT_AMBULATORY_CARE_PROVIDER_SITE_OTHER): Payer: BLUE CROSS/BLUE SHIELD | Admitting: Cardiology

## 2016-08-19 VITALS — BP 124/82 | HR 63 | Ht 65.0 in | Wt 218.0 lb

## 2016-08-19 DIAGNOSIS — I2583 Coronary atherosclerosis due to lipid rich plaque: Secondary | ICD-10-CM

## 2016-08-19 DIAGNOSIS — E784 Other hyperlipidemia: Secondary | ICD-10-CM | POA: Diagnosis not present

## 2016-08-19 DIAGNOSIS — E7849 Other hyperlipidemia: Secondary | ICD-10-CM

## 2016-08-19 DIAGNOSIS — E7800 Pure hypercholesterolemia, unspecified: Secondary | ICD-10-CM | POA: Diagnosis not present

## 2016-08-19 DIAGNOSIS — I2510 Atherosclerotic heart disease of native coronary artery without angina pectoris: Secondary | ICD-10-CM | POA: Diagnosis not present

## 2016-08-19 DIAGNOSIS — R072 Precordial pain: Secondary | ICD-10-CM

## 2016-08-19 DIAGNOSIS — Z8249 Family history of ischemic heart disease and other diseases of the circulatory system: Secondary | ICD-10-CM | POA: Diagnosis not present

## 2016-08-19 NOTE — Progress Notes (Signed)
Patient ID: Candice [name], female   DOB: 1956-03-22, 61 y.o.   MRN: [id number]      HPI Candice [name] is a pleasant patient with prior medical history of hyperlipidemia, coronary CT with mild non-obstructive CAD including a calcified plaque in the left main and family history of premature coronary artery disease. She is currently on Praluent injections and Crestor 40 mg daily with no side effects. She denies any chest pain or shortness of breath the last year, she admits that she stopped exercising and gained some weight. 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  Pt reports she has been on cholesterol medications since she was 17 (started with cholestyramine) Family History: Candice [name] died of an MI in his 38s.  Current Medications: Crestor 40mg  daily Past Intolerances: Zetia (myalgias) Physical exam negative for tendon xanthomas or corneal arcus.   Current Outpatient Prescriptions on File Prior to Visit  Medication Sig Dispense Refill  . Alirocumab (PRALUENT) 75 MG/ML SOPN Inject 75 mg into the skin every 14 (fourteen) days. 2 pen 6  . CALCIUM PO Take 1 capsule by mouth daily.    . CRESTOR 40 MG tablet Take 1 tablet (40 mg total) by mouth daily. Please call and schedule an appointment for further refills 30 tablet 0   No current facility-administered medications on file prior to visit.     Allergies  Allergen Reactions  . Prozac [Fluoxetine]     Suicidal thoughts  . Terramycin [Oxytetracycline] Rash    As a child     Assessment and Plan  61 year old female  1. Hyperlipidemia - mixed with her daughter component, shape is currently on Praluent and rosuvastatin,   Her LDL has improved from 337-78. Triglycerides improved from 218-117 and HDL has increased from 40 once a 67.   We will repeat again prior to next appointment in 1 year.  2. Occasional non-exertional chest pain - most probably GERD, only when lying flat. Feels better on exertion - Coronary  CT showed mild non-obstructive CAD including a calcified plaque in the left main. She is currently asymptomatic and her EKG shows no new ischemic changes, will continue aggressive medical management no ischemic workup needed at this time.   3. BP -controlled  4. Diabetes screening - send HbA1c 5.2 %  5. Palpitations - normal CMP, CBC, TSH  Follow u in 1 year with lipids, CMP, TSH and CBC prior to the appointment in 1 year.  Ena Dawley, MD 08/19/2016

## 2016-08-19 NOTE — Patient Instructions (Addendum)
Medication Instructions:   Your physician recommends that you continue on your current medications as directed. Please refer to the Current Medication list given to you today.   Labwork:  PRIOR TO YOUR ONE YEAR FOLLOW-UP APPOINTMENT WITH DR NELSON TO CHECK---CMET, CBC W DIFF, TSH, AND LIPIDS---PLEASE COME FASTING TO THIS LAB APPOINTMENT    Follow-Up:  Your physician wants you to follow-up in: [place] will receive a reminder letter in the mail two months in advance. If you don't receive a letter, please call our office to schedule the follow-up appointment.  PLEASE HAVE YOUR LABS DONE PRIOR TO YOUR ONE YEAR FOLLOW-UP APPOINTMENT        If you need a refill on your cardiac medications before your next appointment, please call your pharmacy.

## 2016-08-22 ENCOUNTER — Other Ambulatory Visit: Payer: Self-pay | Admitting: *Deleted

## 2016-08-22 MED ORDER — CRESTOR 40 MG PO TABS: 40.0000 mg | ORAL_TABLET | Freq: Every day | ORAL | 3 refills | Status: DC

## 2016-10-26 DIAGNOSIS — Z1231 Encounter for screening mammogram for malignant neoplasm of breast: Secondary | ICD-10-CM | POA: Diagnosis not present

## 2016-11-04 DIAGNOSIS — M8589 Other specified disorders of bone density and structure, multiple sites: Secondary | ICD-10-CM | POA: Diagnosis not present

## 2016-11-15 DIAGNOSIS — Z6834 Body mass index (BMI) 34.0-34.9, adult: Secondary | ICD-10-CM | POA: Diagnosis not present

## 2016-11-15 DIAGNOSIS — Z01419 Encounter for gynecological examination (general) (routine) without abnormal findings: Secondary | ICD-10-CM | POA: Diagnosis not present

## 2016-12-06 ENCOUNTER — Other Ambulatory Visit: Payer: Self-pay | Admitting: Cardiology

## 2017-01-16 ENCOUNTER — Other Ambulatory Visit: Payer: Self-pay | Admitting: Pharmacist

## 2017-01-16 MED ORDER — ALIROCUMAB 75 MG/ML ~~LOC~~ SOPN: 1.0000 "pen " | PEN_INJECTOR | SUBCUTANEOUS | 3 refills | Status: DC

## 2017-02-20 ENCOUNTER — Telehealth: Payer: Self-pay | Admitting: Cardiology

## 2017-02-20 NOTE — Telephone Encounter (Signed)
Called pt back and she confirmed that she wanted name brand only for the Crestor 40 mg tablet. I called the pharmacy to inform them of this information. [facility] verbalized understanding.

## 2017-02-20 NOTE — Telephone Encounter (Signed)
Called pt and left a message asking pt to call back to clarify if she would like [place] to sent in a generic of Crestor to her pharmacy, which is requesting it. [facility] pharmacy stated that the pt's insurance coverage does not cover the name brand.

## 2017-02-20 NOTE — Telephone Encounter (Signed)
Follow Up:; ° ° °Returning your call. °

## 2017-02-28 ENCOUNTER — Telehealth: Payer: Self-pay

## 2017-02-28 MED ORDER — ROSUVASTATIN CALCIUM 40 MG PO TABS: 40.0000 mg | ORAL_TABLET | Freq: Every day | ORAL | 1 refills | Status: DC

## 2017-02-28 NOTE — Telephone Encounter (Signed)
I received a form to fill out from [facility] for name brand Crestor that the pt is requesting. According to the pts chart she has not tried any other statin so a PA is unfavorable. I called the pt to ask if she has tired any other statins and she stated that she has not and that her insurance has changed and that she does want to try the generic form of Crestor.  Rosuvastatin RX has been sent to [facility] pharm to fill and I have called them as well with change.

## 2017-03-20 ENCOUNTER — Other Ambulatory Visit: Payer: BLUE CROSS/BLUE SHIELD

## 2017-07-08 DIAGNOSIS — N390 Urinary tract infection, site not specified: Secondary | ICD-10-CM | POA: Diagnosis not present

## 2017-11-23 DIAGNOSIS — Z6835 Body mass index (BMI) 35.0-35.9, adult: Secondary | ICD-10-CM | POA: Diagnosis not present

## 2017-11-23 DIAGNOSIS — Z01419 Encounter for gynecological examination (general) (routine) without abnormal findings: Secondary | ICD-10-CM | POA: Diagnosis not present

## 2017-11-23 DIAGNOSIS — Z1151 Encounter for screening for human papillomavirus (HPV): Secondary | ICD-10-CM | POA: Diagnosis not present

## 2017-12-01 DIAGNOSIS — Z1231 Encounter for screening mammogram for malignant neoplasm of breast: Secondary | ICD-10-CM | POA: Diagnosis not present

## 2018-03-12 DIAGNOSIS — H1789 Other corneal scars and opacities: Secondary | ICD-10-CM | POA: Diagnosis not present

## 2018-03-12 DIAGNOSIS — H5213 Myopia, bilateral: Secondary | ICD-10-CM | POA: Diagnosis not present

## 2018-06-30 DIAGNOSIS — N3000 Acute cystitis without hematuria: Secondary | ICD-10-CM | POA: Diagnosis not present

## 2019-01-09 ENCOUNTER — Telehealth: Payer: Self-pay | Admitting: *Deleted

## 2019-01-09 NOTE — Telephone Encounter (Signed)

## 2019-01-11 ENCOUNTER — Telehealth (INDEPENDENT_AMBULATORY_CARE_PROVIDER_SITE_OTHER): Payer: BLUE CROSS/BLUE SHIELD | Admitting: Physician Assistant

## 2019-01-11 ENCOUNTER — Other Ambulatory Visit: Payer: Self-pay

## 2019-01-11 ENCOUNTER — Encounter: Payer: Self-pay | Admitting: Physician Assistant

## 2019-01-11 VITALS — Ht 65.0 in | Wt 214.0 lb

## 2019-01-11 DIAGNOSIS — Z87898 Personal history of other specified conditions: Secondary | ICD-10-CM

## 2019-01-11 DIAGNOSIS — Z8679 Personal history of other diseases of the circulatory system: Secondary | ICD-10-CM | POA: Diagnosis not present

## 2019-01-11 DIAGNOSIS — Z8249 Family history of ischemic heart disease and other diseases of the circulatory system: Secondary | ICD-10-CM | POA: Diagnosis not present

## 2019-01-11 DIAGNOSIS — E785 Hyperlipidemia, unspecified: Secondary | ICD-10-CM | POA: Diagnosis not present

## 2019-01-11 DIAGNOSIS — I2510 Atherosclerotic heart disease of native coronary artery without angina pectoris: Secondary | ICD-10-CM

## 2019-01-11 MED ORDER — ROSUVASTATIN CALCIUM 40 MG PO TABS: 40.0000 mg | ORAL_TABLET | Freq: Every day | ORAL | 1 refills | Status: DC

## 2019-01-11 NOTE — Progress Notes (Signed)
Virtual Visit via Video Note   This visit type was conducted due to national recommendations for restrictions regarding the COVID-19 Pandemic (e.g. social distancing) in an effort to limit this patient's exposure and mitigate transmission in our community.  Due to her co-morbid illnesses, this patient is at least at moderate risk for complications without adequate follow up.  This format is felt to be most appropriate for this patient at this time.  All issues noted in this document were discussed and addressed.  A limited physical exam was performed with this format.  Please refer to the patient's chart for her consent to telehealth for [facility].   Date:  01/11/2019   ID:  Candice [name], DOB 02-May-1956, MRN [id number]  Patient Location: Home Provider Location: Home  PCP:  Patient, No Pcp Per  Cardiologist:  Ena Dawley, MD  Electrophysiologist:  None   Evaluation Performed:  Follow-Up Visit  Chief Complaint:  Overdue f/u (2018) of risk factors, CAD  History of Present Illness:    Candice [name] is a 63 y.o. female with hyperlipidemia, coronary CT with mild nonobstructive CAD, mild calcification of aorta, family history of premature CAD (dad died of MI in this 46s), ? CKD stage III per chart, palpitations who presents for overdue follow-up. She has been followed in the past by Dr. Meda Coffee for risk factors. Coronary CTA 04/2015 showed calcium score of 16, 76th percentile for age/sex matched control, mild nonobstructive CAD, mild diffuse calcification in ascending aorta.   At one point her LDL was as high as 337. With the combination of Crestor 40mg  and Praluent she achieved excellent result. Last labs 03/2016 showed normal LFTs, LDL 78, 06/2015 normal CMET K 4.6, Cr 0.96, 2015 TSH and CBC wnl. The patient apologetically admits she hasn't followed up like she should since that time. At one point her insurance at work changed and she was supposed to make phone calls to switch her  Praluent but never did. She's been off of that for a while. She ran out of Crestor a few months ago. Aside from that she's been feeling great. She denies any CP, SOB, palpitations, syncope, dizziness, TIA/stroke. Her son-in-law is a PA, graduated from [employer].  The patient does not have symptoms concerning for COVID-19 infection (fever, chills, cough, or new shortness of breath).     Past Medical History:  Diagnosis Date  . Calcification of aorta (HCC)   . CKD (chronic kidney disease), stage III ([place])   . Hyperlipidemia    LDL goal less than 70, followed by [place] [facility]  . Mild CAD    a. by cardic CT 2016.  Marland Kitchen Snoring    loud, no sleep study in past   Past Surgical History:  Procedure Laterality Date  . BREAST SURGERY Bilateral 2000   augmentation  . COLONOSCOPY WITH PROPOFOL N/A 02/11/2014   Procedure: COLONOSCOPY WITH PROPOFOL;  Surgeon: Garlan Fair, MD;  Location: [facility];  Service: Endoscopy;  Laterality: N/A;     Current Meds  Medication Sig  . Calcium 600-200 MG-UNIT tablet Take 1 tablet by mouth 2 (two) times daily.     Allergies:   Prozac [fluoxetine] and Terramycin [oxytetracycline]   Social History   Tobacco Use  . Smoking status: Never Smoker  . Smokeless tobacco: Never Used  Substance Use Topics  . Alcohol use: Yes    Comment: occ  . Drug use: No     Family Hx: The patient's family history includes Alcohol  abuse in her father; CAD in her paternal grandfather; Colon cancer in her paternal aunt; Heart attack (age of onset: 87) in her father; Osteoarthritis in her mother.  ROS:   Please see the history of present illness.    All other systems reviewed and are negative.   Prior CV studies:   Most recent pertinent cardiac studies are outlined above.   Labs/Other Tests and Data Reviewed:    EKG:  An ECG dated 08/19/16 was personally reviewed today and demonstrated:  NSR 63bpm no acute STT changes  Recent Labs: No results found for requested  labs within last 8760 hours.   Recent Lipid Panel Lab Results  Component Value Date/Time   CHOL 168 03/22/2016 07:38 AM   TRIG 117 03/22/2016 07:38 AM   HDL 67 03/22/2016 07:38 AM   CHOLHDL 2.5 03/22/2016 07:38 AM   LDLCALC 78 03/22/2016 07:38 AM   LDLDIRECT 176.9 12/31/2012 07:38 AM    Wt Readings from Last 3 Encounters:  01/11/19 214 lb (97.1 kg)  08/19/16 218 lb (98.9 kg)  05/25/15 209 lb (94.8 kg)     Objective:    Vital Signs:  Ht 5\' 5"  (1.651 m)   Wt 214 lb (97.1 kg)   BMI 35.61 kg/m    VITAL SIGNS:  reviewed  General - pleasant WF in no acute distress HEENT - NCAT, EOM intact Pulm - No labored breathing, no coughing during visit, no audible wheezing, speaking in full sentences Neuro - A+Ox3, no slurred speech, answers questions appropriately Psych - Pleasant affect     ASSESSMENT & PLAN:    1. Mild CAD - asymptomatic. Need to manage lipids and risk factors as below. I did give her instructions for getting a BP cuff to periodically check HR and BP, and to call with that info to supplement today's visit. 2. Hyperlipidemia - will restart Crestor. I elected not to repeat baseline LFTs since they have been historically normal and we wish to limit patient's exposure to only absolutely necessary during Covid pandemic. Will send message to pharmacy team to help get back on track with Praluent. Recheck CMET and lipid profile in 2 months. No sense checking lipids now since we know they'll be terrible. She also plans to work on losing weight. 3. History of palpitations - quiescent. Does not require medical therapy at this time. 4. Family history of early CAD - she does not have a PCP but I gave her some local recommendations. I encouraged her to obtain one due to necessary age appropriate screenings. Given her family history and overdue labs will plan to obtain full panel with CMET, CBC, lipid profile, TSH and A1C when she comes in for labs as above.   COVID-19 Education: The  signs and symptoms of COVID-19 were discussed with the patient and how to seek care for testing (follow up with PCP or arrange E-visit).  The importance of social distancing was discussed today.  Time:   Today, I have spent 17 minutes with the patient with telehealth technology discussing the above problems.     Medication Adjustments/Labs and Tests Ordered: Current medicines are reviewed at length with the patient today.  Concerns regarding medicines are outlined above.   Disposition:  Follow up in 1 year with in-person visit with Dr. Meda Coffee  Signed, Charlie Pitter, PA-C  01/11/2019 4:56 PM    [place]

## 2019-01-11 NOTE — Patient Instructions (Signed)
Medication Instructions:  Your physician has recommended you make the following change in your medication:  1.  START Rosuvastatin 40 mg taking 1 tablet every evening  If you need a refill on your cardiac medications before your next appointment, please call your pharmacy.   Lab work: 03/19/2019 @ 8:30 COME TO THE OFFICE FOR LAB WORK.  MAKE SURE YOU ARE FASTING.  NOTHING TO EAT OR DRINK AFTER MIDNIGHT THE NIGHT BEFORE,. WE WILL BE DRAWING:  LIPID, CMET, TSH, CMET A1C, AND CBC  If you have labs (blood work) drawn today and your tests are completely normal, you will receive your results only by: Marland Kitchen MyChart Message (if you have MyChart) OR . A paper copy in the mail If you have any lab test that is abnormal or we need to change your treatment, we will call you to review the results.  Testing/Procedures: None ordered  Follow-Up: At [facility], you and your health needs are our priority.  As part of our continuing mission to provide you with exceptional heart care, we have created designated Provider Care Teams.  These Care Teams include your primary Cardiologist (physician) and Advanced Practice Providers (APPs -  Physician Assistants and Nurse Practitioners) who all work together to provide you with the care you need, when you need it. You will need a follow up appointment in 12 months.  Please call our office 2 months in advance to schedule this appointment.  You may see Ena Dawley, MD or one of the following Advanced Practice Providers on your designated Care Team:   [place], PA-C Melina Copa, PA-C . Ermalinda Barrios, PA-C  Any Other Special Instructions Will Be Listed Below (If Applicable).  Please get a blood pressure cuff that goes on your arm. The wrist ones can be inaccurate. If possible, try to select one that also reports your heart rate. To check your blood pressure, choose a time about 3 hours after taking your blood pressure medicines. Remain seated in a chair for 5  minutes quietly beforehand, then check it. When you get a cuff, please get those readings and call us/send in MyChart message with them for our review.   For primary care I would recommend she consider establishing with either Dr. Merri Ray, Dr. Domingo Mend, or Dr. Garret Reddish. Getting a PCP is extremely important not only for preventative care, but to establish a relationship for someone that "knows" you when other acute issues arise.

## 2019-01-14 ENCOUNTER — Telehealth: Payer: Self-pay | Admitting: Pharmacist

## 2019-01-14 DIAGNOSIS — Z8262 Family history of osteoporosis: Secondary | ICD-10-CM | POA: Diagnosis not present

## 2019-01-14 DIAGNOSIS — M8589 Other specified disorders of bone density and structure, multiple sites: Secondary | ICD-10-CM | POA: Diagnosis not present

## 2019-01-14 MED ORDER — ALIROCUMAB 75 MG/ML ~~LOC~~ SOAJ: 75.0000 mg | SUBCUTANEOUS | 3 refills | Status: DC

## 2019-01-14 NOTE — Telephone Encounter (Signed)
Received message about need to get coverage of Praluent through new insurance.   Spoke with patient, she states that her insurance remains the same, but she now needs to pick RX up from [facility], rather than specialty pharmacy.   I provided her with new copay card information as she does not have access to old card info and have placed hard copy in the mail. Pt is aware to active card prior to use.

## 2019-01-22 DIAGNOSIS — Z6835 Body mass index (BMI) 35.0-35.9, adult: Secondary | ICD-10-CM | POA: Diagnosis not present

## 2019-01-22 DIAGNOSIS — Z01419 Encounter for gynecological examination (general) (routine) without abnormal findings: Secondary | ICD-10-CM | POA: Diagnosis not present

## 2019-01-22 DIAGNOSIS — Z1231 Encounter for screening mammogram for malignant neoplasm of breast: Secondary | ICD-10-CM | POA: Diagnosis not present

## 2019-03-19 ENCOUNTER — Other Ambulatory Visit: Payer: BLUE CROSS/BLUE SHIELD

## 2019-03-19 DIAGNOSIS — H5213 Myopia, bilateral: Secondary | ICD-10-CM | POA: Diagnosis not present

## 2019-05-30 DIAGNOSIS — N3000 Acute cystitis without hematuria: Secondary | ICD-10-CM | POA: Diagnosis not present

## 2019-06-28 DIAGNOSIS — N3000 Acute cystitis without hematuria: Secondary | ICD-10-CM | POA: Diagnosis not present

## 2019-06-29 DIAGNOSIS — B029 Zoster without complications: Secondary | ICD-10-CM | POA: Diagnosis not present

## 2019-07-07 ENCOUNTER — Other Ambulatory Visit: Payer: Self-pay | Admitting: Physician Assistant

## 2019-08-28 ENCOUNTER — Telehealth: Payer: Self-pay

## 2019-08-28 MED ORDER — REPATHA SURECLICK 140 MG/ML ~~LOC~~ SOAJ: 140.0000 mg | SUBCUTANEOUS | 11 refills | Status: DC

## 2019-08-28 MED ORDER — ROSUVASTATIN CALCIUM 40 MG PO TABS: ORAL_TABLET | ORAL | 1 refills | Status: AC

## 2019-08-28 NOTE — Telephone Encounter (Signed)
Called and spoke w/pt regarding the approval of repatha, rx sent, refill for crestor sent per pt request, instructed the pt to call back if the drug is unaffordable, pt voiced understanding

## 2019-08-29 ENCOUNTER — Telehealth: Payer: Self-pay | Admitting: Pharmacist

## 2019-08-29 NOTE — Telephone Encounter (Signed)
Patient called requesting copay card for Repatha. Card activated and faxed to [facility]

## 2019-09-02 ENCOUNTER — Telehealth: Payer: Self-pay | Admitting: Cardiology

## 2019-09-02 NOTE — Telephone Encounter (Signed)
[facility] pharmacy calling stating that pt needs a prior auth on Repatha. Please address

## 2019-09-02 NOTE — Telephone Encounter (Signed)
Called and spoke w/pharmacy about it and they stated that they were able to get it to go through just fine.

## 2020-09-21 ENCOUNTER — Other Ambulatory Visit: Payer: Self-pay

## 2020-09-21 MED ORDER — REPATHA SURECLICK 140 MG/ML ~~LOC~~ SOAJ: 140.0000 mg | SUBCUTANEOUS | 11 refills | Status: DC

## 2021-10-02 ENCOUNTER — Other Ambulatory Visit: Payer: Self-pay | Admitting: Physician Assistant

## 2021-11-04 ENCOUNTER — Other Ambulatory Visit: Payer: Self-pay | Admitting: Physician Assistant

## 2022-08-17 ENCOUNTER — Other Ambulatory Visit (HOSPITAL_COMMUNITY): Payer: Self-pay

## 2022-08-18 ENCOUNTER — Telehealth: Payer: Self-pay

## 2022-08-18 NOTE — Telephone Encounter (Signed)
Pharmacy Patient Advocate Encounter  Prior Authorization for REPATHA 140 MG/ML INJ has been approved.    PA# [id number] Effective dates: 07/18/22 through 08/18/23  Karie Soda, [place] Patient Advocate Specialist Direct Number: [phone] Fax: [phone]

## 2022-08-18 NOTE — Telephone Encounter (Signed)
Pharmacy Patient Advocate Encounter   Received notification from [place] that prior authorization for REPATHA 140 MG/ML INJ is needed.    PA submitted on 08/18/22 Key [id number] Status is pending  Karie Soda, [place] Patient Advocate Specialist Direct Number: [phone] Fax: [phone]
